# Patient Record
Sex: Female | Born: 1940 | Race: White | Hispanic: No | Smoking: Current every day smoker
Health system: Southern US, Community
[De-identification: ages and names within clinical notes are randomized; demographics above are authoritative.]

## PROBLEM LIST (undated history)

## (undated) DIAGNOSIS — K439 Ventral hernia without obstruction or gangrene: Secondary | ICD-10-CM

---

## 2010-01-17 ENCOUNTER — Emergency Department (HOSPITAL_COMMUNITY): Admission: EM | Admit: 2010-01-17 | Discharge: 2010-01-17 | Payer: Self-pay | Admitting: Emergency Medicine

## 2010-07-22 LAB — URINALYSIS, ROUTINE W REFLEX MICROSCOPIC
Bilirubin Urine: NEGATIVE
Glucose, UA: NEGATIVE mg/dL
Hgb urine dipstick: NEGATIVE
Ketones, ur: NEGATIVE mg/dL
Protein, ur: NEGATIVE mg/dL
pH: 6.5 (ref 5.0–8.0)

## 2010-07-22 LAB — BASIC METABOLIC PANEL
CO2: 21 mEq/L (ref 19–32)
Calcium: 9 mg/dL (ref 8.4–10.5)
Chloride: 104 mEq/L (ref 96–112)
GFR calc Af Amer: >60 mL/min (ref >60)
Glucose, Bld: 134 mg/dL — ABNORMAL HIGH (ref 70–99)
Sodium: 134 mEq/L — ABNORMAL LOW (ref 135–145)

## 2010-07-22 LAB — CBC
Hemoglobin: 17.5 g/dL — ABNORMAL HIGH (ref 12.0–15.0)
MCH: 33 pg (ref 26.0–34.0)
MCHC: 35.2 g/dL (ref 30.0–36.0)
MCV: 93.6 fL (ref 78.0–100.0)
RBC: 5.31 MIL/uL — ABNORMAL HIGH (ref 3.87–5.11)

## 2020-12-24 ENCOUNTER — Encounter (HOSPITAL_COMMUNITY): Payer: Self-pay | Admitting: Internal Medicine

## 2020-12-24 ENCOUNTER — Emergency Department (HOSPITAL_COMMUNITY): Payer: Medicare HMO

## 2020-12-24 ENCOUNTER — Inpatient Hospital Stay (HOSPITAL_COMMUNITY)
Admission: EM | Admit: 2020-12-24 | Discharge: 2020-12-26 | DRG: 394 | Disposition: A | Discharge status: Home / Self Care | Payer: Medicare HMO | Attending: Family Medicine | Admitting: Family Medicine

## 2020-12-24 ENCOUNTER — Inpatient Hospital Stay (HOSPITAL_COMMUNITY): Payer: Medicare HMO

## 2020-12-24 DIAGNOSIS — Z9049 Acquired absence of other specified parts of digestive tract: Secondary | ICD-10-CM

## 2020-12-24 DIAGNOSIS — L03311 Cellulitis of abdominal wall: Secondary | ICD-10-CM | POA: Diagnosis present

## 2020-12-24 DIAGNOSIS — Z720 Tobacco use: Secondary | ICD-10-CM | POA: Diagnosis not present

## 2020-12-24 DIAGNOSIS — K436 Other and unspecified ventral hernia with obstruction, without gangrene: Principal | ICD-10-CM | POA: Diagnosis present

## 2020-12-24 DIAGNOSIS — I714 Abdominal aortic aneurysm, without rupture, unspecified: Secondary | ICD-10-CM | POA: Diagnosis present

## 2020-12-24 DIAGNOSIS — D72829 Elevated white blood cell count, unspecified: Secondary | ICD-10-CM | POA: Diagnosis not present

## 2020-12-24 DIAGNOSIS — L0390 Cellulitis, unspecified: Secondary | ICD-10-CM

## 2020-12-24 DIAGNOSIS — F17210 Nicotine dependence, cigarettes, uncomplicated: Secondary | ICD-10-CM | POA: Diagnosis present

## 2020-12-24 DIAGNOSIS — R112 Nausea with vomiting, unspecified: Secondary | ICD-10-CM | POA: Diagnosis not present

## 2020-12-24 DIAGNOSIS — K460 Unspecified abdominal hernia with obstruction, without gangrene: Secondary | ICD-10-CM | POA: Diagnosis not present

## 2020-12-24 DIAGNOSIS — Z20822 Contact with and (suspected) exposure to covid-19: Secondary | ICD-10-CM | POA: Diagnosis present

## 2020-12-24 DIAGNOSIS — K439 Ventral hernia without obstruction or gangrene: Secondary | ICD-10-CM | POA: Diagnosis not present

## 2020-12-24 DIAGNOSIS — K56609 Unspecified intestinal obstruction, unspecified as to partial versus complete obstruction: Secondary | ICD-10-CM | POA: Diagnosis present

## 2020-12-24 HISTORY — DX: Ventral hernia without obstruction or gangrene: K43.9

## 2020-12-24 LAB — CBC WITH DIFFERENTIAL/PLATELET
Abs Immature Granulocytes: 6.5 10*3/uL — ABNORMAL HIGH (ref 0.00–0.07)
Basophils Absolute: 0 10*3/uL (ref 0.0–0.1)
Basophils Relative: 0 %
Eosinophils Absolute: 1 10*3/uL — ABNORMAL HIGH (ref 0.0–0.5)
Eosinophils Relative: 2 %
HCT: 39.8 % (ref 36.0–46.0)
Hemoglobin: 12.6 g/dL (ref 12.0–15.0)
Lymphocytes Relative: 4 %
Lymphs Abs: 2 10*3/uL (ref 0.7–4.0)
MCH: 33.8 pg (ref 26.0–34.0)
MCHC: 31.7 g/dL (ref 30.0–36.0)
MCV: 106.7 fL — ABNORMAL HIGH (ref 80.0–100.0)
Metamyelocytes Relative: 2 %
Monocytes Absolute: 5 10*3/uL — ABNORMAL HIGH (ref 0.1–1.0)
Monocytes Relative: 10 %
Myelocytes: 10 %
Neutro Abs: 35.6 10*3/uL — ABNORMAL HIGH (ref 1.7–7.7)
Neutrophils Relative %: 71 %
Platelets: 177 10*3/uL (ref 150–400)
Promyelocytes Relative: 1 %
RBC: 3.73 MIL/uL — ABNORMAL LOW (ref 3.87–5.11)
RDW: 15.1 % (ref 11.5–15.5)
WBC: 50.1 10*3/uL (ref 4.0–10.5)
nRBC: 0 % (ref 0.0–0.2)
nRBC: 0 /100 WBC

## 2020-12-24 LAB — COMPREHENSIVE METABOLIC PANEL
ALT: 19 U/L (ref 0–44)
AST: 43 U/L — ABNORMAL HIGH (ref 15–41)
Albumin: 4.1 g/dL (ref 3.5–5.0)
Alkaline Phosphatase: 72 U/L (ref 38–126)
Anion gap: 10 (ref 5–15)
BUN: 11 mg/dL (ref 8–23)
CO2: 25 mmol/L (ref 22–32)
Calcium: 9.1 mg/dL (ref 8.9–10.3)
Chloride: 106 mmol/L (ref 98–111)
Creatinine, Ser: 0.77 mg/dL (ref 0.44–1.00)
GFR, Estimated: >60 mL/min (ref >60)
Glucose, Bld: 136 mg/dL — ABNORMAL HIGH (ref 70–99)
Potassium: 3.7 mmol/L (ref 3.5–5.1)
Sodium: 141 mmol/L (ref 135–145)
Total Bilirubin: 1.2 mg/dL (ref 0.3–1.2)
Total Protein: 6.6 g/dL (ref 6.5–8.1)

## 2020-12-24 LAB — URINALYSIS, ROUTINE W REFLEX MICROSCOPIC
Bilirubin Urine: NEGATIVE
Glucose, UA: NEGATIVE mg/dL
Ketones, ur: NEGATIVE mg/dL
Leukocytes,Ua: NEGATIVE
Nitrite: NEGATIVE
Protein, ur: NEGATIVE mg/dL
Specific Gravity, Urine: 1.011 (ref 1.005–1.030)
pH: 5 (ref 5.0–8.0)

## 2020-12-24 LAB — RESP PANEL BY RT-PCR (FLU A&B, COVID) ARPGX2
Influenza A by PCR: NEGATIVE
Influenza B by PCR: NEGATIVE
SARS Coronavirus 2 by RT PCR: NEGATIVE

## 2020-12-24 LAB — LIPASE, BLOOD: Lipase: 25 U/L (ref 11–51)

## 2020-12-24 MED ORDER — IOHEXOL 350 MG/ML SOLN
75.0000 mL | Freq: Once | INTRAVENOUS | Status: AC | PRN
  Administered 2020-12-24: 75 mL via INTRAVENOUS

## 2020-12-24 MED ORDER — CEFAZOLIN SODIUM-DEXTROSE 1-4 GM/50ML-% IV SOLN
1.0000 g | Freq: Once | INTRAVENOUS | Status: AC
  Administered 2020-12-24: 1 g via INTRAVENOUS
  Filled 2020-12-24: qty 50

## 2020-12-24 MED ORDER — LACTATED RINGERS IV SOLN: INTRAVENOUS | Status: DC

## 2020-12-24 MED ORDER — ACETAMINOPHEN 650 MG RE SUPP: 650.0000 mg | Freq: Four times a day (QID) | RECTAL | Status: DC | PRN

## 2020-12-24 MED ORDER — FENTANYL CITRATE PF 50 MCG/ML IJ SOSY
25.0000 ug | PREFILLED_SYRINGE | INTRAMUSCULAR | Status: DC | PRN
  Administered 2020-12-25: 25 ug via INTRAVENOUS
  Filled 2020-12-24 (×2): qty 1

## 2020-12-24 MED ORDER — CEFAZOLIN SODIUM-DEXTROSE 1-4 GM/50ML-% IV SOLN
1.0000 g | Freq: Three times a day (TID) | INTRAVENOUS | Status: DC
  Administered 2020-12-25 – 2020-12-26 (×4): 1 g via INTRAVENOUS
  Filled 2020-12-24 (×5): qty 50

## 2020-12-24 MED ORDER — ONDANSETRON HCL 4 MG/2ML IJ SOLN
4.0000 mg | Freq: Four times a day (QID) | INTRAMUSCULAR | Status: DC | PRN
  Filled 2020-12-24: qty 2

## 2020-12-24 MED ORDER — ACETAMINOPHEN 325 MG PO TABS: 650.0000 mg | ORAL_TABLET | Freq: Four times a day (QID) | ORAL | Status: DC | PRN

## 2020-12-24 MED ORDER — ONDANSETRON HCL 4 MG/2ML IJ SOLN
4.0000 mg | Freq: Once | INTRAMUSCULAR | Status: AC
  Administered 2020-12-24: 4 mg via INTRAVENOUS
  Filled 2020-12-24: qty 2

## 2020-12-24 MED ORDER — NALOXONE HCL 0.4 MG/ML IJ SOLN: 0.4000 mg | INTRAMUSCULAR | Status: DC | PRN

## 2020-12-24 NOTE — Consult Note (Signed)
Reason for Consult: Large abdominal wall hernia with bowel obstruction Referring Physician: Arline Asp is an 80 y.o. female.  HPI: 80 year old female who does not regularly see a physician presented to the emergency department complaining of abdominal pain.  She reports that she has a large abdominal wall hernia which she has had for 15 years or more.  Over the past 2 days she felt it became larger and she had pain in the area.  This initially started after her dog ran across her abdomen.  She had 1 episode of emesis today and came to the emergency department for further evaluation.  She underwent CT scan of the abdomen and pelvis which shows a very large hernia with evidence of bowel obstruction.  There is also evidence of cellulitis in her pannus on CT.  She has not noticed this and it is not evident on exam.  She reports her pain has resolved and her hernia is much softer now.  I was asked to see her for surgical management.  No past medical history on file.  No family history on file.  Social History:  has no history on file for tobacco use, alcohol use, and drug use.  Allergies: Not on File  Medications: I have reviewed the patient's current medications.  Results for orders placed or performed during the hospital encounter of 12/24/20 (from the past 48 hour(s))  CBC with Differential     Status: Abnormal   Collection Time: 12/24/20  5:03 PM  Result Value Ref Range   WBC 50.1 (HH) 4.0 - 10.5 K/uL    Comment: This critical result has verified and been called to C STRAUGHN,RN by Doran Durand on 08 18 2022 at 1903, and has been read back.    RBC 3.73 (L) 3.87 - 5.11 MIL/uL   Hemoglobin 12.6 12.0 - 15.0 g/dL   HCT 35.7 01.7 - 79.3 %   MCV 106.7 (H) 80.0 - 100.0 fL   MCH 33.8 26.0 - 34.0 pg   MCHC 31.7 30.0 - 36.0 g/dL   RDW 90.3 00.9 - 23.3 %   Platelets 177 150 - 400 K/uL   nRBC 0.0 0.0 - 0.2 %   Neutrophils Relative % 71 %   Neutro Abs 35.6 (H) 1.7 - 7.7 K/uL    Lymphocytes Relative 4 %   Lymphs Abs 2.0 0.7 - 4.0 K/uL   Monocytes Relative 10 %   Monocytes Absolute 5.0 (H) 0.1 - 1.0 K/uL   Eosinophils Relative 2 %   Eosinophils Absolute 1.0 (H) 0.0 - 0.5 K/uL   Basophils Relative 0 %   Basophils Absolute 0.0 0.0 - 0.1 K/uL   WBC Morphology See Note     Comment: Moderate Left Shift. >5% Metas and Myelos, Occ Pro Noted.   RBC Morphology MORPHOLOGY UNREMARKABLE    Smear Review PLATELET COUNT CONFIRMED BY SMEAR    nRBC 0 0 /100 WBC   Metamyelocytes Relative 2 %   Myelocytes 10 %   Promyelocytes Relative 1 %   Abs Immature Granulocytes 6.50 (H) 0.00 - 0.07 K/uL   Polychromasia PRESENT     Comment: Performed at [facility], [address] [place], [place] [address]  Comprehensive metabolic panel     Status: Abnormal   Collection Time: 12/24/20  5:03 PM  Result Value Ref Range   Sodium 141 135 - 145 mmol/L   Potassium 3.7 3.5 - 5.1 mmol/L   Chloride 106 98 - 111 mmol/L  CO2 25 22 - 32 mmol/L   Glucose, Bld 136 (H) 70 - 99 mg/dL    Comment: Glucose reference range applies only to samples taken after fasting for at least 8 hours.   BUN 11 8 - 23 mg/dL   Creatinine, Ser 1.610.77 0.44 - 1.00 mg/dL   Calcium 9.1 8.9 - 09.610.3 mg/dL   Total Protein 6.6 6.5 - 8.1 g/dL   Albumin 4.1 3.5 - 5.0 g/dL   AST 43 (H) 15 - 41 U/L   ALT 19 0 - 44 U/L   Alkaline Phosphatase 72 38 - 126 U/L   Total Bilirubin 1.2 0.3 - 1.2 mg/dL   GFR, Estimated >04>60 >54>60 mL/min    Comment: (NOTE) Calculated using the CKD-EPI Creatinine Equation (2021)    Anion gap 10 5 - 15    Comment: Performed at [facility], [address] [place], [place] [address]  Lipase, blood     Status: None   Collection Time: 12/24/20  5:03 PM  Result Value Ref Range   Lipase 25 11 - 51 U/L    Comment: Performed at [facility], [address] [place], [place] [address]  Resp Panel by RT-PCR (Flu A&B, Covid) Nasopharyngeal Swab     Status: None   Collection Time: 12/24/20  5:05  PM   Specimen: Nasopharyngeal Swab; Nasopharyngeal(NP) swabs in vial transport medium  Result Value Ref Range   SARS Coronavirus 2 by RT PCR NEGATIVE NEGATIVE    Comment: (NOTE) SARS-CoV-2 target nucleic acids are NOT DETECTED.  The SARS-CoV-2 RNA is generally detectable in upper respiratory specimens during the acute phase of infection. The lowest concentration of SARS-CoV-2 viral copies this assay can detect is 138 copies/mL. A negative result does not preclude SARS-Cov-2 infection and should not be used as the sole basis for treatment or other patient management decisions. A negative result may occur with  improper specimen collection/handling, submission of specimen other than nasopharyngeal swab, presence of viral mutation(s) within the areas targeted by this assay, and inadequate number of viral copies(<138 copies/mL). A negative result must be combined with clinical observations, patient history, and epidemiological information. The expected result is Negative.  Fact Sheet for Patients:  BloggerCourse.comhttps://www.fda.gov/media/152166/download  Fact Sheet for Healthcare Providers:  SeriousBroker.ithttps://www.fda.gov/media/152162/download  This test is no t yet approved or cleared by the [place] FDA and  has been authorized for detection and/or diagnosis of SARS-CoV-2 by FDA under an Emergency Use Authorization (EUA). This EUA will remain  in effect (meaning this test can be used) for the duration of the COVID-19 declaration under Section 564(b)(1) of the Act, 21 U.S.C.section 360bbb-3(b)(1), unless the authorization is terminated  or revoked sooner.       Influenza A by PCR NEGATIVE NEGATIVE   Influenza B by PCR NEGATIVE NEGATIVE    Comment: (NOTE) The Xpert Xpress SARS-CoV-2/FLU/RSV plus assay is intended as an aid in the diagnosis of influenza from Nasopharyngeal swab specimens and should not be used as a sole basis for treatment. Nasal washings and aspirates are unacceptable for Xpert  Xpress SARS-CoV-2/FLU/RSV testing.  Fact Sheet for Patients: BloggerCourse.comhttps://www.fda.gov/media/152166/download  Fact Sheet for Healthcare Providers: SeriousBroker.ithttps://www.fda.gov/media/152162/download  This test is not yet approved or cleared by the [place] FDA and has been authorized for detection and/or diagnosis of SARS-CoV-2 by FDA under an Emergency Use Authorization (EUA). This EUA will remain in effect (meaning this test can be used) for the duration of the COVID-19 declaration under Section 564(b)(1) of the  Act, 21 U.S.C. section 360bbb-3(b)(1), unless the authorization is terminated or revoked.  Performed at [facility], [address] [place], [place] [address]   Urinalysis, Routine w reflex microscopic Urine, In & Out Cath     Status: Abnormal   Collection Time: 12/24/20  5:54 PM  Result Value Ref Range   Color, Urine YELLOW YELLOW   APPearance HAZY (A) CLEAR   Specific Gravity, Urine 1.011 1.005 - 1.030   pH 5.0 5.0 - 8.0   Glucose, UA NEGATIVE NEGATIVE mg/dL   Hgb urine dipstick SMALL (A) NEGATIVE   Bilirubin Urine NEGATIVE NEGATIVE   Ketones, ur NEGATIVE NEGATIVE mg/dL   Protein, ur NEGATIVE NEGATIVE mg/dL   Nitrite NEGATIVE NEGATIVE   Leukocytes,Ua NEGATIVE NEGATIVE   RBC / HPF 0-5 0 - 5 RBC/hpf   WBC, UA 0-5 0 - 5 WBC/hpf   Bacteria, UA RARE (A) NONE SEEN   Squamous Epithelial / LPF 0-5 0 - 5   Mucus PRESENT     Comment: Performed at [facility], [address], [place], [place] [address]    CT Abdomen Pelvis W Contrast  Result Date: 12/24/2020 CLINICAL DATA:  Abdominal pain. EXAM: CT ABDOMEN AND PELVIS WITH CONTRAST TECHNIQUE: Multidetector CT imaging of the abdomen and pelvis was performed using the standard protocol following bolus administration of intravenous contrast. CONTRAST:  24mL OMNIPAQUE IOHEXOL 350 MG/ML SOLN COMPARISON:  January 17, 2010 FINDINGS: Lower chest: No acute abnormality. Hepatobiliary: No focal liver abnormality is seen. The  gallbladder is not identified. No biliary dilatation. Pancreas: Unremarkable. No pancreatic ductal dilatation or surrounding inflammatory changes. Spleen: Normal in size without focal abnormality. Adrenals/Urinary Tract: There is diffuse bilateral adrenal gland enlargement. Kidneys are normal, without renal calculi, focal lesion, or hydronephrosis. Bladder is unremarkable. Stomach/Bowel: Stomach is within normal limits. The appendix is not identified. Numerous dilated small bowel loops are seen within the abdomen and pelvis. A transition zone is seen along a region of mesenteric twisting within the mid pelvis, along the midline (axial CT images 53 through 57, CT series 3/coronal reformatted images 39 through 54, CT series 6). The small bowel loops return to normal caliber proximally within the posterior aspect of the pelvis on the right (axial CT images 60 through 70, CT series 3). Noninflamed diverticula are seen throughout the large bowel. Vascular/Lymphatic: Aortic atherosclerosis with 4.3 cm x 4.0 cm aneurysmal dilatation of the infrarenal abdominal aorta. No enlarged abdominal or pelvic lymph nodes. Reproductive: Status post hysterectomy. No adnexal masses. Other: A very large, approximately 23.3 cm x 9.8 cm x 17.4 cm ventral hernia and associated pannus is seen along the anterior aspect of the lower abdominal and pelvic wall. This contains a mild amount of fluid, numerous dilated small bowel loops and a portion of transverse colon. Diffuse stranding of the mesentery is also seen within this area of herniation. A marked amount of adjacent subcutaneous inflammatory fat stranding is seen within the lower portion of the associated pannus. Musculoskeletal: Multilevel degenerative changes seen throughout the lumbar spine. IMPRESSION: 1. High-grade small bowel obstruction secondary to an area of mesenteric twisting caused by a very large, incarcerated ventral hernia. 2. Large anterior abdominal and pelvic wall pannus  secondary to the previously noted ventral hernia with marked severity inferior cellulitis and edema. 3. 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 4. Colonic diverticulosis. Electronically Signed   By: Aram Candela M.D.   On: 12/24/2020 19:36    Review of Systems  Constitutional:  Negative for activity change.  HENT: Negative.    Eyes: Negative.   Respiratory: Negative.    Cardiovascular: Negative.   Gastrointestinal:  Positive for abdominal distention, abdominal pain and vomiting.  Endocrine: Negative.   Genitourinary: Negative.   Musculoskeletal: Negative.   Allergic/Immunologic: Negative.   Neurological: Negative.   Hematological: Negative.   Psychiatric/Behavioral: Negative.    Blood pressure (!) 150/87, pulse 66, temperature 98.4 F (36.9 C), resp. rate 13, SpO2 98 %. Physical Exam HENT:     Head: Normocephalic.  Eyes:     General: No scleral icterus.    Extraocular Movements: Extraocular movements intact.  Cardiovascular:     Rate and Rhythm: Normal rate and regular rhythm.     Heart sounds: No murmur heard. Pulmonary:     Effort: Pulmonary effort is normal.     Breath sounds: Normal breath sounds.  Abdominal:     Hernia: A hernia is present.     Comments: Very large abdominal hernia in the periumbilical region, it is very soft and partly reduces though it does not completely reduce.  There is no tenderness at this time.  I do not see any cellulitis in the skin that is reported on her CAT scan.  Skin:    General: Skin is warm and dry.  Neurological:     Mental Status: She is alert and oriented to person, place, and time.    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  This hernia is very soft but only partly reduces.  I fear she has lost some domain because it has been out for 15+ years.  Recommend NG tube decompression for now and  we will plan operative repair this admission.  Agree with antibiotics for the cellulitis seen on CAT scan, however, this is not notable on physical exam.  Her leukocytosis may not be all explained by this.  She has an abnormal differential.  I also spoke with her daughter.  We will follow closely.  Liz Malady 12/24/2020, 8:47 PM

## 2020-12-24 NOTE — H&P (Signed)
History and Physical    PLEASE NOTE THAT DRAGON DICTATION SOFTWARE WAS USED IN THE CONSTRUCTION OF THIS NOTE.   Anna Holmes BTD:[id number] DOB: July 09, 1940 DOA: 12/24/2020  PCP: Pcp, No Patient coming from: home   I have personally briefly reviewed patient's old medical records in [place]  Chief Complaint: abdominal pain  HPI: Anna Holmes is a 80 y.o. female with medical history significant for chronic abdominal wall hernia who is admitted to [facility] on 12/24/2020 with small bowel obstruction in the setting of incarcerated abdominal wall hernia after presenting from home to [facility] ED complaining of abdominal pain.   The patient reports 2 days of progressive abdominal pain. Describes pain as sharp discomfort located diffusely across the abdomen, most prominent along the midline, without radiation to back or legs. Has been constant since onset, and notes exacerbation with movement and with palpation over the abdomen. Also notes exacerbation with attempting to eat or drink. Notes associated intermittent nausea over that timeframe resulting in 1 episode of non-bloody/non-bilious emesis, with most recent episode of vomiting occurring earlier today.  Denies any vomiting over the last 2 days, admits diminished flatus production over that timeframe as well.  No recent melena or hematochezia. No recent trauma or travel. Denies any recent dysuria, gross hematuria, or change in urinary urgency/frequency. Denies any associated subjective fever, chills, rigors, or generalized myalgias.  She denies any recent rash, including no known erythema associated with the abdomen.  Of note, the patient knowledges that she is not followed with any medical provider for at least the last 15 years.  Within this context, she reports that her only known prior medical diagnosis is her chronic abdominal wall hernia, which she has had over the course of the last 15 years, without any prior surgical intervention  to correct such.  She denies any prior history of small bowel obstruction.  She notes multiple prior abdominal surgeries, including cholecystectomy at age 89 as well as hysterectomy at age 39, both of which she believes were open procedures.   Over the course of the last week, the patient reports that she has been spending progressively more time outside in her garden, and acknowledges that she has not compensatorily increased her intake of water over that timeframe.   Denies any recent headache, neck stiffness, rhinitis, rhinorrhea, sore throat, shortness of breath, wheezing, cough.  No known recent COVID-19 exposures.  Denies any recent chest pain, diaphoresis, or palpitations.  Not on any blood thinners as an outpatient, including no aspirin.     ED Course:  Vital signs in the ED were notable for the following: Temperature max 98.4, heart rate 66-82; blood pressure 150/87; respiratory rate 13-20, oxygen saturation 98 to 100% on room air.  Labs were notable for the following: CMP notable for the following: Sodium 141, bicarbonate 25, creatinine 0.77, AST 43, otherwise liver enzymes to be within normal limits.  Lipase 25.  CBC notable for the following: wbc of 50, 71% neutrophils, and no prior white blood cell data point available for point comparison, hemoglobin 12.6, platelets 177.  Urinalysis notable for no white blood cells, leukocyte Estrace/nitrate negative.  Screening COVID-19 PCR performed in the ED today was found to be negative.  CT abdomen/pelvis showed high-grade small bowel obstruction secondary to area of mesenteric twisting caused by very large incarcerated ventral hernia as well as large anterior abdominal and pelvic wall pannus secondary to ventral hernia with marked inferior cellulitis.  Otherwise, CT abdomen/pelvis without evidence  of acute intra-abdominal process.   EDP discussed patient's case with the on-call general surgeon, Dr.Thompson, Who has evaluated the patient and found  the abdominal wall hernia to be only partly reducible.  Dr.ThompsonRecommends admission to the hospital service for further evaluation management of SBO secondary to large incarcerated abdominal wall hernia, and plans to formally consult. He asks that patient be kept npo and recommends placement of NG tube, and plans to take the patient to the OR at some point during this hospitalization for surgery relating to presenting large incarcerated abdominal wall hernia.  Shortly, Dr.ThompsonRecommends antibiotics for cellulitis wall pannus identified on today CT abdomen/pelvis.   While in the ED, the following were administered: Ancef 1 g IV x1.     Review of Systems: As per HPI otherwise 10 point review of systems negative.   Past Medical History:  Diagnosis Date   Abdominal wall hernia     Past Surgical History:  Procedure Laterality Date   ABDOMINAL HYSTERECTOMY     did not remove ovaries   CHOLECYSTECTOMY     at age 71 y.o.    Social History:  reports that she has been smoking cigarettes. She has a 15.00 pack-year smoking history. She has never used smokeless tobacco. She reports that she does not drink alcohol and does not use drugs.   Family history reviewed and not pertinent    Prior to Admission medications   Medication Sig Start Date End Date Taking? Authorizing Provider  docusate sodium (COLACE) 100 MG capsule Take 200 mg by mouth daily as needed for mild constipation.   Yes [provider]  Ibuprofen 200 MG CAPS Take 2 capsules by mouth.   Yes [provider]     Objective    Physical Exam: Vitals:   12/24/20 1700 12/24/20 1715 12/24/20 1730 12/24/20 1830  BP: (!) 175/102   (!) 150/87  Pulse: 78 80 71 66  Resp: (!) 21 (!) 25 20 13   Temp:      SpO2: 99% 99% 99% 98%    General: appears to be stated age; alert, oriented Skin: warm, dry, no rash Head:  AT/ Mouth:  Oral mucosa membranes appear dry, normal dentition Neck: supple; trachea  midline Heart:  RRR; did not appreciate any M/R/G Lungs: CTAB, did not appreciate any wheezes, rales, or rhonchi Abdomen: + BS; soft, abdominal wall hernia noted; mild diffuse tenderness in the absence of guarding, rigidity, or rebound tenderness. Vascular: 2+ pedal pulses b/l; 2+ radial pulses b/l Extremities: no peripheral edema, no muscle wasting Neuro: strength and sensation intact in upper and lower extremities b/l     Labs on Admission: I have personally reviewed following labs and imaging studies  CBC: Recent Labs  Lab 12/24/20 1703  WBC 50.1*  NEUTROABS 35.6*  HGB 12.6  HCT 39.8  MCV 106.7*  PLT 250   Basic Metabolic Panel: Recent Labs  Lab 12/24/20 1703  NA 141  K 3.7  CL 106  CO2 25  GLUCOSE 136*  BUN 11  CREATININE 0.77  CALCIUM 9.1   GFR: CrCl cannot be calculated (Unknown ideal weight.). Liver Function Tests: Recent Labs  Lab 12/24/20 1703  AST 43*  ALT 19  ALKPHOS 72  BILITOT 1.2  PROT 6.6  ALBUMIN 4.1   Recent Labs  Lab 12/24/20 1703  LIPASE 25   No results for input(s): AMMONIA in the last 168 hours. Coagulation Profile: No results for input(s): INR, PROTIME in the last 168 hours. Cardiac Enzymes: No results  for input(s): CKTOTAL, CKMB, CKMBINDEX, TROPONINI in the last 168 hours. BNP (last 3 results) No results for input(s): PROBNP in the last 8760 hours. HbA1C: No results for input(s): HGBA1C in the last 72 hours. CBG: No results for input(s): GLUCAP in the last 168 hours. Lipid Profile: No results for input(s): CHOL, HDL, LDLCALC, TRIG, CHOLHDL, LDLDIRECT in the last 72 hours. Thyroid Function Tests: No results for input(s): TSH, T4TOTAL, FREET4, T3FREE, THYROIDAB in the last 72 hours. Anemia Panel: No results for input(s): VITAMINB12, FOLATE, FERRITIN, TIBC, IRON, RETICCTPCT in the last 72 hours. Urine analysis:    Component Value Date/Time   COLORURINE YELLOW 12/24/2020 1754   APPEARANCEUR HAZY (A) 12/24/2020 1754    LABSPEC 1.011 12/24/2020 1754   PHURINE 5.0 12/24/2020 1754   GLUCOSEU NEGATIVE 12/24/2020 1754   HGBUR SMALL (A) 12/24/2020 1754   BILIRUBINUR NEGATIVE 12/24/2020 1754   KETONESUR NEGATIVE 12/24/2020 1754   PROTEINUR NEGATIVE 12/24/2020 1754   UROBILINOGEN 1.0 01/17/2010 1132   NITRITE NEGATIVE 12/24/2020 1754   LEUKOCYTESUR NEGATIVE 12/24/2020 1754    Radiological Exams on Admission: CT Abdomen Pelvis W Contrast  Result Date: 12/24/2020 CLINICAL DATA:  Abdominal pain. EXAM: CT ABDOMEN AND PELVIS WITH CONTRAST TECHNIQUE: Multidetector CT imaging of the abdomen and pelvis was performed using the standard protocol following bolus administration of intravenous contrast. CONTRAST:  63m OMNIPAQUE IOHEXOL 350 MG/ML SOLN COMPARISON:  January 17, 2010 FINDINGS: Lower chest: No acute abnormality. Hepatobiliary: No focal liver abnormality is seen. The gallbladder is not identified. No biliary dilatation. Pancreas: Unremarkable. No pancreatic ductal dilatation or surrounding inflammatory changes. Spleen: Normal in size without focal abnormality. Adrenals/Urinary Tract: There is diffuse bilateral adrenal gland enlargement. Kidneys are normal, without renal calculi, focal lesion, or hydronephrosis. Bladder is unremarkable. Stomach/Bowel: Stomach is within normal limits. The appendix is not identified. Numerous dilated small bowel loops are seen within the abdomen and pelvis. A transition zone is seen along a region of mesenteric twisting within the mid pelvis, along the midline (axial CT images 53 through 57, CT series 3/coronal reformatted images 39 through 54, CT series 6). The small bowel loops return to normal caliber proximally within the posterior aspect of the pelvis on the right (axial CT images 60 through 70, CT series 3). Noninflamed diverticula are seen throughout the large bowel. Vascular/Lymphatic: Aortic atherosclerosis with 4.3 cm x 4.0 cm aneurysmal dilatation of the infrarenal abdominal aorta.  No enlarged abdominal or pelvic lymph nodes. Reproductive: Status post hysterectomy. No adnexal masses. Other: A very large, approximately 23.3 cm x 9.8 cm x 17.4 cm ventral hernia and associated pannus is seen along the anterior aspect of the lower abdominal and pelvic wall. This contains a mild amount of fluid, numerous dilated small bowel loops and a portion of transverse colon. Diffuse stranding of the mesentery is also seen within this area of herniation. A marked amount of adjacent subcutaneous inflammatory fat stranding is seen within the lower portion of the associated pannus. Musculoskeletal: Multilevel degenerative changes seen throughout the lumbar spine. IMPRESSION: 1. High-grade small bowel obstruction secondary to an area of mesenteric twisting caused by a very large, incarcerated ventral hernia. 2. Large anterior abdominal and pelvic wall pannus secondary to the previously noted ventral hernia with marked severity inferior cellulitis and edema. 3. 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 4. Colonic diverticulosis. Electronically Signed   By: TVirgina NorfolkM.D.   On: 12/24/2020 19:36      Assessment/Plan   Anna VANALSTINEis  a 80 y.o. female with medical history significant for chronic abdominal wall hernia who is admitted to [facility] on 12/24/2020 with small bowel obstruction in the setting of incarcerated abdominal wall hernia after presenting from home to [facility] ED complaining of abdominal pain.    Principal Problem:   SBO (small bowel obstruction) (HCC) Active Problems:   Abdominal wall hernia   Nausea & vomiting   Cellulitis   Leukocytosis   Tobacco abuse     #) Small bowel obstruction: dx on the basis of acute onset abdominal pain over the last 2 days associated with nausea/vomiting and diminished flatus production, with CT abdomen/pelvis,  suggestive of high grade sbo secondary to area of mesenteric twisting caused by large incarcerated ventral hernia.   While the patient appears to have history of multiple prior open abdominal surgeries 30 to 40 years ago, as further outlined above, appears that her presenting SBO is more likely on the basis of the aforementioned large incarcerated ventral hernia. No evidence of peritoneal signs on initial physical exam. Dr. Grandville Silos of gen surg consulted, as above, with associated recommendations including n.p.o., NG tube, and plan to take the patient to the OR at some point during this hospitalization for surgical intervention relating to large incarcerated abdominal wall hernia.  Conservative measures to be initiated overnight, as further detailed below.   Plan: Strict NPO. LR @ 75 cc per hour. Prn IV fentanyl. Prn IV Zofran. NG tube attached to low, intermittent wall suction. SCD's for DVT prophylaxis  Recheck CMP and CBC in the morning. Check INR. General surgery consult, as above.  Preop EKG ordered.  Further evaluation management of underlying abdominal wall hernia, as further detailed below.     #) Large incarcerated abdominal wall hernia: In the setting of the patient's report of a chronic abdominal wall hernia over the course of the last 15 years, she notes progressive increase in size of this hernia over the last 2 days associated with worsening abdominal discomfort, with presenting SBO felt to be on the basis of this large incarcerated abdominal wall hernia, as further detailed above.  General surgery consulted, considering finding that hernias on the partly reducible.  General surgery anticipates taking patient to the OR for surgical intervention relating to this abdominal wall hernia at some point during this hospitalization, as above.  Plan: NPO.  As needed IV fentanyl.  As needed IV Zofran.  Continuous IV fluids, as above.  Check INR.  Check preoperative EKG.  General surgery formally consulted, as above.  Repeat CMP and CBC in the morning.       #) Cellulitis of the pelvic wall pannus: while no  evidence of significant erythema on physical exam, today CT abdomen/pelvis shows evidence of pelvic wall pannus with marked cellulitis.  General surgery consult associated with recommendation for continuation of IV antibiotics for such.  No evidence of associated abscess.  Of note, while the has an impressive leukocytosis, no additional SIRS criteria are met at this time.  Therefore criteria for sepsis are not currently met.  Patient's cellulitis, in the setting of a single SIRS criteria meets criteria to be considered moderately severe, with associated recommendation from antibiotic stewardship committee include initiation of IV cephalosporin for nonpurulent cellulitis of moderate severity.  Of note, the patient received a dose of Ancef in the ED this evening prior to collection of any blood cultures.  No evidence of additional underlying infectious process at this time.   Plan: Continue Ancef, per rationale above.  Repeat CBC in the morning.  As needed IV fentanyl, as above.       #) Leukocytosis: In the absence of any prior CBC results for point comparison, today CBC reflects significantly elevated white blood cell count of 50,000 with 71% neutrophils.  Likely multifactorial in nature, with at least some contribution from inflammation in the setting of incarcerated abdominal wall hernia, resultant SBO, as well as marked cellulitis of the pelvic wall pannus, as further identified.  Additional contribution suspected from hemoconcentration in the setting of dehydration given the patient's recent working outside garden without compensatory increase in oral intake of water, further complicated by increase in GI losses over the last 1 to 2 days.  will add on peripheral smear to be evaluated by pathologist.  No evidence to suggest C. difficile colitis.   Plan: Add on peripheral smear to be evaluated by pathologist.  IV fluids, as above.  Monitor strict I's and O's and daily weights.  Repeat CBC with  differential in the morning.  Further evaluation management of SBO and abdominal wall hernia, as above.       #) Chronic tobacco abuse: The patient reports that she has a current smoker, smoking a variable amount of cigarettes on a daily basis over the course of the last 30 to 40 years.   Plan: Counseled the patient on the importance of complete smoking discontinuation.        DVT prophylaxis: SCDs Code Status: Full code Family Communication: Case was discussed with the patient's daughter, who was present at bedside Disposition Plan: Per Rounding Team Consults called: Dr. Grandville Silos of general surgery consulted, as further detailed above;  Admission status: Inpatient; MedSurg     Of note, this patient was added by me to the following Admit List/Treatment Team: [email or web address].      PLEASE NOTE THAT DRAGON DICTATION SOFTWARE WAS USED IN THE CONSTRUCTION OF THIS NOTE.   [place] [facility] Pager [phone] From [place]  Otherwise, please contact night-coverage  [email or web address] Password [facility]   12/24/2020, 9:33 PM

## 2020-12-24 NOTE — ED Triage Notes (Signed)
Pt BIB [facility] after reporting abd pain since last night, worse today after her dog jumped on her abd at a hernia site that she believes is worsening. Pt reports to EMS bladder spasms with difficulty urinating and constipation. All symptoms fentanyl, N/V manifested after last dose of fentanyl at 1533. Pt received fentanyl en route, pt reports 5/10 pain at this time. A&O x4

## 2020-12-24 NOTE — ED Notes (Signed)
Patient transported to CT 

## 2020-12-24 NOTE — ED Provider Notes (Addendum)
[facility] EMERGENCY DEPARTMENT Provider Note   CSN: [id number] Arrival date & time: 12/24/20  1551     History Chief Complaint  Patient presents with  . Abdominal Pain    Anna Holmes is a 80 y.o. female.   Abdominal Pain Associated symptoms: nausea and vomiting   Associated symptoms: no diarrhea, no dysuria, no fatigue, no fever and no hematuria    Patient presents with abdominal pain x2 days.  She states it started when her schnauzer jumped across her abdomen, the pain resolved but started again this morning without inciting event.  She states she usually has a hernia, but the pain surrounding the hernia is more than normal and it is more distended than it has been in the past.  She has not been able to reduce it herself, although she states she has not tried that hard.  She has tried Tylenol at home, was given fentanyl on the way to the ED.  She started feeling nauseated and had 1 episode of emesis.  No episodes of emesis while at home.  Pain is constant, localized to the abdominal site.  No skin discoloration.  No past medical history on file.  There are no problems to display for this patient.      OB History   No obstetric history on file.     No family history on file.     Home Medications Prior to Admission medications   Not on File    Allergies    Patient has no allergy information on record.  Review of Systems   Review of Systems  Constitutional:  Negative for fatigue and fever.  Gastrointestinal:  Positive for abdominal pain, nausea and vomiting. Negative for diarrhea.  Genitourinary:  Negative for dysuria and hematuria.   Physical Exam Updated Vital Signs BP (!) 148/131   Pulse 85   Temp 98.4 F (36.9 C)   Resp 18   SpO2 99%   Physical Exam Vitals and nursing note reviewed. Exam conducted with a chaperone present.  Constitutional:      General: She is not in acute distress.    Appearance: Normal appearance.  HENT:      Head: Normocephalic and atraumatic.  Eyes:     General: No scleral icterus.    Extraocular Movements: Extraocular movements intact.     Pupils: Pupils are equal, round, and reactive to light.  Abdominal:     Tenderness: There is generalized abdominal tenderness.     Hernia: A hernia is present.     Comments: Voluntary guarding to the hernia, please see attached photos.  Skin:    Coloration: Skin is not jaundiced.  Neurological:     Mental Status: She is alert. Mental status is at baseline.     Coordination: Coordination normal.       ED Results / Procedures / Treatments   Labs (all labs ordered are listed, but only abnormal results are displayed) Labs Reviewed  CBC WITH DIFFERENTIAL/PLATELET  COMPREHENSIVE METABOLIC PANEL  LIPASE, BLOOD  URINALYSIS, ROUTINE W REFLEX MICROSCOPIC    EKG None  Radiology No results found.  Procedures Procedures   Medications Ordered in ED Medications - No data to display  ED Course  I have reviewed the triage vital signs and the nursing notes.  Pertinent labs & imaging results that were available during my care of the patient were reviewed by me and considered in my medical decision making (see chart for details).  Clinical Course as of  12/24/20 1918  Thu Dec 24, 2020  1830 Urinalysis, Routine w reflex microscopic Urine, In & Out Cath(!) [HS]  1830 No UTI [HS]  1830 Lipase, blood Not consistent with pancreatitis  [HS]  1831 Comprehensive metabolic panel(!) No electrolyte derangement or anion gap  [HS]  1918 SBO - needs admission  [HS]    Clinical Course User Index [HS] Theron Arista, PA-C   MDM Rules/Calculators/A&P                           Patient presents with stable vitals, though mildly hypertensive.  Pain is controlled with fentanyl, she is currently feeling nauseated will give Zofran.  Abdomen is soft, although there is an obvious hernia.  No discoloration suggesting strangulated, suspect is incarcerated.  Will get CT  with contrast ideally to assess, will also try to manually reduce.  Patient has a small bowel obstruction.  She will need to be admitted. Dr. Edwin Dada to follow and admit.   Final Clinical Impression(s) / ED Diagnoses Final diagnoses:  None    Rx / DC Orders ED Discharge Orders     None        Theron Arista, PA-C 12/24/20 1858    Theron Arista, PA-C 12/24/20 1916    Franne Forts, DO 12/25/20 0018

## 2020-12-24 NOTE — ED Provider Notes (Addendum)
   8:24 PM Patient signed out to me by APP physician.   Patient is a 80 yo female presenting with large abdominal hernia and pain. CT suggestive of large incarcerated hernia resulting in SBO. Pt placed NPO. General surgery, Dr. Violeta Gelinas consulted and up to date on patient's status. NG tube ordered. Patient informed of findings and recommendations for admission.   Vitals:   12/24/20 1730 12/24/20 1830  BP:  (!) 150/87  Pulse: 71 66  Resp: 20 13  Temp:    SpO2: 99% 98%    Physical Exam Vitals and nursing note reviewed.  Constitutional:      General: She is not in acute distress.    Appearance: She is well-developed.  Abdominal:     General: There is distension.     Tenderness: There is abdominal tenderness.       Comments: Large abdominal hernia. Unable to reduce at bedside.    Neurological:     Mental Status: She is alert.     .Critical Care  Date/Time: 12/25/2020 12:55 AM Performed by: Franne Forts, DO Authorized by: Franne Forts, DO   Critical care provider statement:    Critical care time (minutes):  44   Critical care was necessary to treat or prevent imminent or life-threatening deterioration of the following conditions: Incarcerated hernia resulting in cellulitis and small obstruction requiring immediate revaluation by surgical team, npo status, and ng tube placement. Plan for surgery in AM.   Critical care was time spent personally by me on the following activities:  Development of treatment plan with patient or surrogate, discussions with consultants, discussions with primary provider, evaluation of patient's response to treatment, examination of patient, ordering and performing treatments and interventions, ordering and review of laboratory studies, ordering and review of radiographic studies, re-evaluation of patient's condition and review of old charts   I assumed direction of critical care for this patient from another provider in my specialty: yes      Care discussed with: admitting provider     Care discussed with comment:  Discussed with general surgery       Franne Forts, DO 12/25/20 0019    Franne Forts, DO 12/25/20 [id number]

## 2020-12-25 DIAGNOSIS — D72829 Elevated white blood cell count, unspecified: Secondary | ICD-10-CM | POA: Diagnosis present

## 2020-12-25 DIAGNOSIS — K439 Ventral hernia without obstruction or gangrene: Secondary | ICD-10-CM | POA: Diagnosis present

## 2020-12-25 DIAGNOSIS — R112 Nausea with vomiting, unspecified: Secondary | ICD-10-CM | POA: Diagnosis present

## 2020-12-25 DIAGNOSIS — I714 Abdominal aortic aneurysm, without rupture, unspecified: Secondary | ICD-10-CM | POA: Diagnosis present

## 2020-12-25 DIAGNOSIS — K460 Unspecified abdominal hernia with obstruction, without gangrene: Secondary | ICD-10-CM

## 2020-12-25 DIAGNOSIS — L0390 Cellulitis, unspecified: Secondary | ICD-10-CM | POA: Diagnosis present

## 2020-12-25 DIAGNOSIS — Z720 Tobacco use: Secondary | ICD-10-CM | POA: Diagnosis present

## 2020-12-25 LAB — COMPREHENSIVE METABOLIC PANEL
ALT: 16 U/L (ref 0–44)
AST: 38 U/L (ref 15–41)
Albumin: 3.6 g/dL (ref 3.5–5.0)
Alkaline Phosphatase: 67 U/L (ref 38–126)
Anion gap: 11 (ref 5–15)
BUN: 10 mg/dL (ref 8–23)
CO2: 25 mmol/L (ref 22–32)
Calcium: 8.8 mg/dL — ABNORMAL LOW (ref 8.9–10.3)
Chloride: 102 mmol/L (ref 98–111)
Creatinine, Ser: 0.74 mg/dL (ref 0.44–1.00)
GFR, Estimated: >60 mL/min (ref >60)
Glucose, Bld: 84 mg/dL (ref 70–99)
Potassium: 3.5 mmol/L (ref 3.5–5.1)
Sodium: 138 mmol/L (ref 135–145)
Total Bilirubin: 1 mg/dL (ref 0.3–1.2)
Total Protein: 5.9 g/dL — ABNORMAL LOW (ref 6.5–8.1)

## 2020-12-25 LAB — CBC WITH DIFFERENTIAL/PLATELET
Abs Immature Granulocytes: 8.53 10*3/uL — ABNORMAL HIGH (ref 0.00–0.07)
Basophils Absolute: 0.2 10*3/uL — ABNORMAL HIGH (ref 0.0–0.1)
Basophils Relative: 0 %
Eosinophils Absolute: 0.1 10*3/uL (ref 0.0–0.5)
Eosinophils Relative: 0 %
HCT: 35.9 % — ABNORMAL LOW (ref 36.0–46.0)
Hemoglobin: 11.4 g/dL — ABNORMAL LOW (ref 12.0–15.0)
Immature Granulocytes: 14 %
Lymphocytes Relative: 4 %
Lymphs Abs: 2.5 10*3/uL (ref 0.7–4.0)
MCH: 33.8 pg (ref 26.0–34.0)
MCHC: 31.8 g/dL (ref 30.0–36.0)
MCV: 106.5 fL — ABNORMAL HIGH (ref 80.0–100.0)
Monocytes Absolute: 6.7 10*3/uL — ABNORMAL HIGH (ref 0.1–1.0)
Monocytes Relative: 11 %
Neutro Abs: 43.3 10*3/uL — ABNORMAL HIGH (ref 1.7–7.7)
Neutrophils Relative %: 71 %
Platelets: 187 10*3/uL (ref 150–400)
RBC: 3.37 MIL/uL — ABNORMAL LOW (ref 3.87–5.11)
RDW: 15.2 % (ref 11.5–15.5)
WBC: 61.4 10*3/uL (ref 4.0–10.5)
nRBC: 0 % (ref 0.0–0.2)

## 2020-12-25 LAB — PROTIME-INR
INR: 1.2 (ref 0.8–1.2)
Prothrombin Time: 15.5 seconds — ABNORMAL HIGH (ref 11.4–15.2)

## 2020-12-25 LAB — MAGNESIUM: Magnesium: 1.9 mg/dL (ref 1.7–2.4)

## 2020-12-25 MED ORDER — DIAZEPAM 5 MG/ML IJ SOLN
5.0000 mg | Freq: Four times a day (QID) | INTRAMUSCULAR | Status: DC | PRN
Start: 2020-12-25 — End: 2020-12-26

## 2020-12-25 MED ORDER — PHENOL 1.4 % MT LIQD
1.0000 | OROMUCOSAL | Status: DC | PRN
  Administered 2020-12-25: 1 via OROMUCOSAL
  Filled 2020-12-25: qty 177

## 2020-12-25 MED ORDER — DIAZEPAM 5 MG/ML IJ SOLN
2.5000 mg | Freq: Four times a day (QID) | INTRAMUSCULAR | Status: DC | PRN
  Administered 2020-12-25: 2.5 mg via INTRAVENOUS
  Filled 2020-12-25: qty 2

## 2020-12-25 NOTE — ED Notes (Signed)
Report given to [place] Bailiff, RN of [phone]

## 2020-12-25 NOTE — Progress Notes (Signed)
Dr Rito Ehrlich notified of  pt c/o of pain to left throat, left neck, ear area----pt states not sure if medicine given to her fentanyl IV helped any, only focused on the NG tube in, and arguing with daughter about she's going home tomorrow, she doesn't care about what any one says and wants to pull NGT out.    Attempted to explain need and purpose of the NG tube and what could happen to her (feel worse if gastric drainage backs up and makes her vomit and abd pain return).  Pt getting anxious and continues to argue with daughter. Pt and daughter left alone to continue talking.     Pt  received valium IV in the ER before coming up to unit.  Pt has prn order for if needs when time again.  Dr Rito Ehrlich gave orders for pt.

## 2020-12-25 NOTE — Progress Notes (Signed)
Subjective/Chief Complaint: Pt with some pain but feels better with NGT output   Objective: Vital signs in last 24 hours: Temp:  [97.8 F (36.6 C)-98.4 F (36.9 C)] 97.8 F (36.6 C) (08/19 0035) Pulse Rate:  [57-85] 72 (08/19 1015) Resp:  [13-25] 19 (08/19 1015) BP: (115-181)/(59-131) 146/69 (08/19 1015) SpO2:  [96 %-100 %] 99 % (08/19 1015)    Intake/Output from previous day: 08/18 0701 - 08/19 0700 In: 91.1 [IV Piggyback:91.1] Out: -  Intake/Output this shift: No intake/output data recorded.  PE:  Constitutional: No acute distress, conversant, appears states age. Eyes: Anicteric sclerae, moist conjunctiva, no lid lag Lungs: Clear to auscultation bilaterally, normal respiratory effort CV: regular rate and rhythm, no murmurs, no peripheral edema, pedal pulses 2+ GI: Soft, no masses or hepatosplenomegaly, non-tender to palpation, soft, large hernia  Skin: No rashes, palpation reveals normal turgor Psychiatric: appropriate judgment and insight, oriented to person, place, and time   Lab Results:  Recent Labs    12/24/20 1703 12/25/20 0218  WBC 50.1* 61.4*  HGB 12.6 11.4*  HCT 39.8 35.9*  PLT 177 187   BMET Recent Labs    12/24/20 1703 12/25/20 0218  NA 141 138  K 3.7 3.5  CL 106 102  CO2 25 25  GLUCOSE 136* 84  BUN 11 10  CREATININE 0.77 0.74  CALCIUM 9.1 8.8*   PT/INR Recent Labs    12/25/20 0218  LABPROT 15.5*  INR 1.2   ABG No results for input(s): PHART, HCO3 in the last 72 hours.  Invalid input(s): PCO2, PO2  Studies/Results: CT Abdomen Pelvis W Contrast  Result Date: 12/24/2020 CLINICAL DATA:  Abdominal pain. EXAM: CT ABDOMEN AND PELVIS WITH CONTRAST TECHNIQUE: Multidetector CT imaging of the abdomen and pelvis was performed using the standard protocol following bolus administration of intravenous contrast. CONTRAST:  56mL OMNIPAQUE IOHEXOL 350 MG/ML SOLN COMPARISON:  January 17, 2010 FINDINGS: Lower chest: No acute abnormality.  Hepatobiliary: No focal liver abnormality is seen. The gallbladder is not identified. No biliary dilatation. Pancreas: Unremarkable. No pancreatic ductal dilatation or surrounding inflammatory changes. Spleen: Normal in size without focal abnormality. Adrenals/Urinary Tract: There is diffuse bilateral adrenal gland enlargement. Kidneys are normal, without renal calculi, focal lesion, or hydronephrosis. Bladder is unremarkable. Stomach/Bowel: Stomach is within normal limits. The appendix is not identified. Numerous dilated small bowel loops are seen within the abdomen and pelvis. A transition zone is seen along a region of mesenteric twisting within the mid pelvis, along the midline (axial CT images 53 through 57, CT series 3/coronal reformatted images 39 through 54, CT series 6). The small bowel loops return to normal caliber proximally within the posterior aspect of the pelvis on the right (axial CT images 60 through 70, CT series 3). Noninflamed diverticula are seen throughout the large bowel. Vascular/Lymphatic: Aortic atherosclerosis with 4.3 cm x 4.0 cm aneurysmal dilatation of the infrarenal abdominal aorta. No enlarged abdominal or pelvic lymph nodes. Reproductive: Status post hysterectomy. No adnexal masses. Other: A very large, approximately 23.3 cm x 9.8 cm x 17.4 cm ventral hernia and associated pannus is seen along the anterior aspect of the lower abdominal and pelvic wall. This contains a mild amount of fluid, numerous dilated small bowel loops and a portion of transverse colon. Diffuse stranding of the mesentery is also seen within this area of herniation. A marked amount of adjacent subcutaneous inflammatory fat stranding is seen within the lower portion of the associated pannus. Musculoskeletal: Multilevel degenerative changes seen throughout the  lumbar spine. IMPRESSION: 1. High-grade small bowel obstruction secondary to an area of mesenteric twisting caused by a very large, incarcerated ventral  hernia. 2. Large anterior abdominal and pelvic wall pannus secondary to the previously noted ventral hernia with marked severity inferior cellulitis and edema. 3. 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 4. Colonic diverticulosis. Electronically Signed   By: Aram Candela M.D.   On: 12/24/2020 19:36   DG Chest Portable 1 View  Result Date: 12/24/2020 CLINICAL DATA:  NG placement. EXAM: PORTABLE CHEST 1 VIEW COMPARISON:  None. FINDINGS: Enteric tube with tip and side-port in the left hemiabdomen. The tip of the tube appears in the distal body of the stomach. No bowel dilatation in the visualized abdomen. Faint bilateral pulmonary streaky densities. No focal consolidation, pleural effusion, or pneumothorax. The cardiac silhouette is within limits. Atherosclerotic calcification of the aorta. No acute osseous pathology. IMPRESSION: 1. Enteric tube with tip in the distal body of the stomach. 2. Faint bilateral pulmonary streaky densities. Electronically Signed   By: Elgie Collard M.D.   On: 12/24/2020 23:06    Anti-infectives: Anti-infectives (From admission, onward)    Start     Dose/Rate Route Frequency Ordered Stop   12/25/20 0400  ceFAZolin (ANCEF) IVPB 1 g/50 mL premix        1 g 100 mL/hr over 30 Minutes Intravenous Every 8 hours 12/24/20 2132     12/24/20 2100  ceFAZolin (ANCEF) IVPB 1 g/50 mL premix        1 g 100 mL/hr over 30 Minutes Intravenous  Once 12/24/20 2047 12/24/20 2220       Assessment/Plan:  48F with SBO and large ventral hernia Leukocytosis -con't NGT for now -Pt with large hernia on exam and CT.  At this point with her leukocytosis would not want to place any mesh to fix her hernia.  I don't think the cellulitis on CT is accounting for her leukocytosis.  Pt may benefit from SBO treatment with outpt hernia repair once leukocytosis resolves.  LOS: 1 day    Axel Filler 12/25/2020

## 2020-12-25 NOTE — Progress Notes (Signed)
PROGRESS NOTE  Anna Holmes VOZ:[id number] DOB: Jun 08, 1940 DOA: 12/24/2020 PCP: Pcp, No  HPI/Recap of past 24 hours: Patient is a 80 year old female who does not follow with a primary care provider who presented to the emergency room on 8/18 with complaints of abdominal pain and nausea and vomiting.  Abdominal CT noted high-grade small bowel obstruction secondary to incarcerated hernia as well as noted to have some cellulitis.  Lab work noteworthy for markedly elevated white blood cell count of 50.  Patient admitted to hospitalist service and general surgery consulted.  NG tube placed.  Seen by general surgery who in the setting of markedly elevated white blood cell count recommended conservative management for bowel obstruction and outpatient surgical correction of hernia.  Patient herself is a little bit better although has some discomfort from NG tube.  States that she has passed a little gas  Assessment/Plan: Principal Problem:   SBO (small bowel obstruction) (HCC) secondary to abdominal wall hernia with associated cellulitis causing nausea and vomiting: On IV Ancef.  Checking procalcitonin level to ensure infection is worsening given increased white blood cell count.  May need to change antibiotics for bowel coverage.  Appreciate surgery help.  Continue NG tube and recheck abdominal x-ray in the morning. Active Problems:   Leukocytosis: Markedly elevated and given patient's lack of medical follow-up through the years, certainly possible she has lymphoma versus leukemia.  Waiting for peripheral smear reviewed by pathology.  We had an extensive discussion and she was adamant that if she did indeed have cancer, she immediately wanted to stop everything and just go home.  I informed her that is certainly appropriate, but if she did indeed have cancer then I would have oncology see her to give her full treatment options as some cancers are curable/treatable.    Tobacco abuse: Counseled.  Did not  need nicotine patch.    AAA (abdominal aortic aneurysm) (HCC): Incidentally noted on CT scan.  Stable at 4.4 cm.   Code Status: Full code  Family Communication: Daughter at bedside  Disposition Plan: Waiting for follow-up abdominal x-ray, pathology review of cell differential   Consultants: General surgery  Procedures: None  Antimicrobials: IV Ancef 8/18-present  DVT prophylaxis: SCDs  Level of care: Med-Surg   Objective: Vitals:   12/25/20 1406 12/25/20 1434  BP:  133/65  Pulse:  75  Resp:  18  Temp: 98.7 F (37.1 C) 98.7 F (37.1 C)  SpO2:  98%    Intake/Output Summary (Last 24 hours) at 12/25/2020 1613 Last data filed at 12/25/2020 1414 Gross per 24 hour  Intake 91.08 ml  Output 1700 ml  Net -1608.92 ml   There were no vitals filed for this visit. There is no height or weight on file to calculate BMI.  Exam:  General: Alert and oriented x3, no acute distress HEENT: Normocephalic, atraumatic, NG tube in place Cardiovascular: Regular rate and rhythm, S1-S2 Respiratory: Clear to auscultation bilaterally Abdomen: Soft, mild distention, minimal tenderness, hypoactive bowel sounds Musculoskeletal: No clubbing or cyanosis or edema Skin: No skin breaks, tears or lesions Psychiatry: Appropriate, no evidence of psychoses Neurology: No focal deficits   Data Reviewed: CBC: Recent Labs  Lab 12/24/20 1703 12/25/20 0218  WBC 50.1* 61.4*  NEUTROABS 35.6* 43.3*  HGB 12.6 11.4*  HCT 39.8 35.9*  MCV 106.7* 106.5*  PLT 177 187   Basic Metabolic Panel: Recent Labs  Lab 12/24/20 1703 12/25/20 0218  NA 141 138  K 3.7 3.5  CL 106 102  CO2 25 25  GLUCOSE 136* 84  BUN 11 10  CREATININE 0.77 0.74  CALCIUM 9.1 8.8*  MG  --  1.9   GFR: CrCl cannot be calculated (Unknown ideal weight.). Liver Function Tests: Recent Labs  Lab 12/24/20 1703 12/25/20 0218  AST 43* 38  ALT 19 16  ALKPHOS 72 67  BILITOT 1.2 1.0  PROT 6.6 5.9*  ALBUMIN 4.1 3.6    Recent Labs  Lab 12/24/20 1703  LIPASE 25   No results for input(s): AMMONIA in the last 168 hours. Coagulation Profile: Recent Labs  Lab 12/25/20 0218  INR 1.2   Cardiac Enzymes: No results for input(s): CKTOTAL, CKMB, CKMBINDEX, TROPONINI in the last 168 hours. BNP (last 3 results) No results for input(s): PROBNP in the last 8760 hours. HbA1C: No results for input(s): HGBA1C in the last 72 hours. CBG: No results for input(s): GLUCAP in the last 168 hours. Lipid Profile: No results for input(s): CHOL, HDL, LDLCALC, TRIG, CHOLHDL, LDLDIRECT in the last 72 hours. Thyroid Function Tests: No results for input(s): TSH, T4TOTAL, FREET4, T3FREE, THYROIDAB in the last 72 hours. Anemia Panel: No results for input(s): VITAMINB12, FOLATE, FERRITIN, TIBC, IRON, RETICCTPCT in the last 72 hours. Urine analysis:    Component Value Date/Time   COLORURINE YELLOW 12/24/2020 1754   APPEARANCEUR HAZY (A) 12/24/2020 1754   LABSPEC 1.011 12/24/2020 1754   PHURINE 5.0 12/24/2020 1754   GLUCOSEU NEGATIVE 12/24/2020 1754   HGBUR SMALL (A) 12/24/2020 1754   BILIRUBINUR NEGATIVE 12/24/2020 1754   KETONESUR NEGATIVE 12/24/2020 1754   PROTEINUR NEGATIVE 12/24/2020 1754   UROBILINOGEN 1.0 01/17/2010 1132   NITRITE NEGATIVE 12/24/2020 1754   LEUKOCYTESUR NEGATIVE 12/24/2020 1754   Sepsis Labs: @LABRCNTIP (procalcitonin:4,lacticidven:4)  ) Recent Results (from the past 240 hour(s))  Resp Panel by RT-PCR (Flu A&B, Covid) Nasopharyngeal Swab     Status: None   Collection Time: 12/24/20  5:05 PM   Specimen: Nasopharyngeal Swab; Nasopharyngeal(NP) swabs in vial transport medium  Result Value Ref Range Status   SARS Coronavirus 2 by RT PCR NEGATIVE NEGATIVE Final    Comment: (NOTE) SARS-CoV-2 target nucleic acids are NOT DETECTED.  The SARS-CoV-2 RNA is generally detectable in upper respiratory specimens during the acute phase of infection. The lowest concentration of SARS-CoV-2 viral copies  this assay can detect is 138 copies/mL. A negative result does not preclude SARS-Cov-2 infection and should not be used as the sole basis for treatment or other patient management decisions. A negative result may occur with  improper specimen collection/handling, submission of specimen other than nasopharyngeal swab, presence of viral mutation(s) within the areas targeted by this assay, and inadequate number of viral copies(<138 copies/mL). A negative result must be combined with clinical observations, patient history, and epidemiological information. The expected result is Negative.  Fact Sheet for Patients:  12/26/20  Fact Sheet for Healthcare Providers:  BloggerCourse.com  This test is no t yet approved or cleared by the [employer] FDA and  has been authorized for detection and/or diagnosis of SARS-CoV-2 by FDA under an Emergency Use Authorization (EUA). This EUA will remain  in effect (meaning this test can be used) for the duration of the COVID-19 declaration under Section 564(b)(1) of the Act, 21 U.S.C.section 360bbb-3(b)(1), unless the authorization is terminated  or revoked sooner.       Influenza A by PCR NEGATIVE NEGATIVE Final   Influenza B by PCR NEGATIVE NEGATIVE Final    Comment: (NOTE) The Xpert Xpress SARS-CoV-2/FLU/RSV plus assay is  intended as an aid in the diagnosis of influenza from Nasopharyngeal swab specimens and should not be used as a sole basis for treatment. Nasal washings and aspirates are unacceptable for Xpert Xpress SARS-CoV-2/FLU/RSV testing.  Fact Sheet for Patients: BloggerCourse.com  Fact Sheet for Healthcare Providers: [employer]ious[employer]ker[employer]it  This test is not yet approved or cleared by the [place] FDA and has been authorized for detection and/or diagnosis of SARS-CoV-2 by FDA under an Emergency Use Authorization (EUA). This EUA  will remain in effect (meaning this test can be used) for the duration of the COVID-19 declaration under Section 564(b)(1) of the Act, 21 U.S.C. section 360bbb-3(b)(1), unless the authorization is terminated or revoked.  Performed at [facility], [address] [place], [place] [address]       Studies: CT Abdomen Pelvis W Contrast  Result Date: 12/24/2020 CLINICAL DATA:  Abdominal pain. EXAM: CT ABDOMEN AND PELVIS WITH CONTRAST TECHNIQUE: Multidetector CT imaging of the abdomen and pelvis was performed using the standard protocol following bolus administration of intravenous contrast. CONTRAST:  22mL OMNIPAQUE IOHEXOL 350 MG/ML SOLN COMPARISON:  January 17, 2010 FINDINGS: Lower chest: No acute abnormality. Hepatobiliary: No focal liver abnormality is seen. The gallbladder is not identified. No biliary dilatation. Pancreas: Unremarkable. No pancreatic ductal dilatation or surrounding inflammatory changes. Spleen: Normal in size without focal abnormality. Adrenals/Urinary Tract: There is diffuse bilateral adrenal gland enlargement. Kidneys are normal, without renal calculi, focal lesion, or hydronephrosis. Bladder is unremarkable. Stomach/Bowel: Stomach is within normal limits. The appendix is not identified. Numerous dilated small bowel loops are seen within the abdomen and pelvis. A transition zone is seen along a region of mesenteric twisting within the mid pelvis, along the midline (axial CT images 53 through 57, CT series 3/coronal reformatted images 39 through 54, CT series 6). The small bowel loops return to normal caliber proximally within the posterior aspect of the pelvis on the right (axial CT images 60 through 70, CT series 3). Noninflamed diverticula are seen throughout the large bowel. Vascular/Lymphatic: Aortic atherosclerosis with 4.3 cm x 4.0 cm aneurysmal dilatation of the infrarenal abdominal aorta. No enlarged abdominal or pelvic lymph nodes. Reproductive: Status post  hysterectomy. No adnexal masses. Other: A very large, approximately 23.3 cm x 9.8 cm x 17.4 cm ventral hernia and associated pannus is seen along the anterior aspect of the lower abdominal and pelvic wall. This contains a mild amount of fluid, numerous dilated small bowel loops and a portion of transverse colon. Diffuse stranding of the mesentery is also seen within this area of herniation. A marked amount of adjacent subcutaneous inflammatory fat stranding is seen within the lower portion of the associated pannus. Musculoskeletal: Multilevel degenerative changes seen throughout the lumbar spine. IMPRESSION: 1. High-grade small bowel obstruction secondary to an area of mesenteric twisting caused by a very large, incarcerated ventral hernia. 2. Large anterior abdominal and pelvic wall pannus secondary to the previously noted ventral hernia with marked severity inferior cellulitis and edema. 3. 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 4. Colonic diverticulosis. Electronically Signed   By: Aram Candela M.D.   On: 12/24/2020 19:36   DG Chest Portable 1 View  Result Date: 12/24/2020 CLINICAL DATA:  NG placement. EXAM: PORTABLE CHEST 1 VIEW COMPARISON:  None. FINDINGS: Enteric tube with tip and side-port in the left hemiabdomen. The tip of the tube appears in the distal body of the stomach. No bowel dilatation in the visualized abdomen. Faint bilateral pulmonary streaky densities. No focal  consolidation, pleural effusion, or pneumothorax. The cardiac silhouette is within limits. Atherosclerotic calcification of the aorta. No acute osseous pathology. IMPRESSION: 1. Enteric tube with tip in the distal body of the stomach. 2. Faint bilateral pulmonary streaky densities. Electronically Signed   By: Elgie CollardArash  Radparvar M.D.   On: 12/24/2020 23:06    Scheduled Meds:  Continuous Infusions:   ceFAZolin (ANCEF) IV Stopped (12/25/20 1435)   lactated ringers 75 mL/hr at 12/25/20 0915     LOS: 1 day      Hollice EspySendil K Arwa Yero, MD [facility]   12/25/2020, 4:13 PM

## 2020-12-26 ENCOUNTER — Inpatient Hospital Stay (HOSPITAL_COMMUNITY): Payer: Medicare HMO

## 2020-12-26 LAB — PATHOLOGIST SMEAR REVIEW

## 2020-12-26 NOTE — Discharge Summary (Signed)
Physician Discharge Summary  Anna Holmes WUJ:[id number] DOB: 03-22-1941 DOA: 12/24/2020  PCP: Pcp, No  Admit date: 12/24/2020 Discharge date: 12/26/2020  Admitted From: Home  Disposition:  Home   Recommendations for Outpatient Follow-up:  Follow up with Dr. Derrell Lolling per patient Follow up with Oncology, instructions given      Home Health: None  Equipment/Devices: None new  Discharge Condition: Unclear  CODE STATUS: FULL Diet recommendation: Soft for now, advance diet slowly  Brief/Interim Summary: 80 yo F with no known PMHx who presented with acute abdominal pain and nausea and vomiting.  Abdominal CT noted high-grade small bowel obstruction secondary to incarcerated hernia as well as noted to have some cellulitis.  Lab work noteworthy for markedly elevated white blood cell count of 50.  Patient admitted to hospitalist service and general surgery consulted.  NG tube placed.     PRINCIPAL HOSPITAL DIAGNOSIS: Small bowel obstruction    Discharge Diagnoses:   Small bowel obstruction  Paitent admitted and NG tube plcaed.  Vomiting stopped.    Her NG tube was removed overnight, and she refused replacement nor further cares this morning, stated she was leaving.  Daughter present, confirmed her respect for patient's wishes.  Patient drank water all night without difficulty.  Has no redness or pain in the abdomen to warrant any antibiotics at discharge  Refused further care.  Stated she liked Dr. Derrell Lolling and was given Dr. Jacinto Halim phone number to reach him if she chooses to follow up.    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  Peripheral smear with primarily myelocytes.  Reviewed with Oncology who recommended in patient consultation and outpatient follow up for bone marrow biops within 1 week.   This was outlined to family, including the treatable nature of CML, and likely progression of untreated CML, as well as signs or symptoms of untreated CML.  Patient refused care,  stated unambiguously that she would not follow up with oncology.  Articulated a reasonable understanding of the potential benefits and harms of treatment.    She was provided with follow up instructions with Oncology should she change her mind.   Smoking  AAA Incidental finding, 4.4 cm.         Discharge Instructions  Discharge Instructions     Discharge instructions   Complete by: As directed    You were admitted for a small bowel obstruction You also have a hernia, which caused the obstruction  Advance your diet slowly Return for returning pain like you had before or vomiting that doesn't stop Go see Dr. Derrell Lolling listed at the number below   You were also found to have high white blood cell count.  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 I have respected your wishes not to arrange follow up Call the Cancer [facility] at the number below if desired Return for care if you have fever, bruising, confusion, severe weakness or bleeding   Increase activity slowly   Complete by: As directed       Allergies as of 12/26/2020   Not on File      Medication List     TAKE these medications    docusate sodium 100 MG capsule Commonly known as: COLACE Take 200 mg by mouth daily as needed for mild constipation.   Ibuprofen 200 MG Caps Take 2 capsules by mouth.        Follow-up Information     Axel Filler, MD Follow  up.   Specialty: General Surgery Contact information: [address] [place] [address] [phone]         Ladene Artist, MD .   Specialty: Oncology Contact information: [address] [place] [address] [phone]                Not on File  Consultations: General Surgery Oncology, patient left before being seen   Procedures/Studies: CT Abdomen Pelvis W Contrast  Result Date: 12/24/2020 CLINICAL DATA:  Abdominal pain. EXAM: CT ABDOMEN AND  PELVIS WITH CONTRAST TECHNIQUE: Multidetector CT imaging of the abdomen and pelvis was performed using the standard protocol following bolus administration of intravenous contrast. CONTRAST:  46mL OMNIPAQUE IOHEXOL 350 MG/ML SOLN COMPARISON:  January 17, 2010 FINDINGS: Lower chest: No acute abnormality. Hepatobiliary: No focal liver abnormality is seen. The gallbladder is not identified. No biliary dilatation. Pancreas: Unremarkable. No pancreatic ductal dilatation or surrounding inflammatory changes. Spleen: Normal in size without focal abnormality. Adrenals/Urinary Tract: There is diffuse bilateral adrenal gland enlargement. Kidneys are normal, without renal calculi, focal lesion, or hydronephrosis. Bladder is unremarkable. Stomach/Bowel: Stomach is within normal limits. The appendix is not identified. Numerous dilated small bowel loops are seen within the abdomen and pelvis. A transition zone is seen along a region of mesenteric twisting within the mid pelvis, along the midline (axial CT images 53 through 57, CT series 3/coronal reformatted images 39 through 54, CT series 6). The small bowel loops return to normal caliber proximally within the posterior aspect of the pelvis on the right (axial CT images 60 through 70, CT series 3). Noninflamed diverticula are seen throughout the large bowel. Vascular/Lymphatic: Aortic atherosclerosis with 4.3 cm x 4.0 cm aneurysmal dilatation of the infrarenal abdominal aorta. No enlarged abdominal or pelvic lymph nodes. Reproductive: Status post hysterectomy. No adnexal masses. Other: A very large, approximately 23.3 cm x 9.8 cm x 17.4 cm ventral hernia and associated pannus is seen along the anterior aspect of the lower abdominal and pelvic wall. This contains a mild amount of fluid, numerous dilated small bowel loops and a portion of transverse colon. Diffuse stranding of the mesentery is also seen within this area of herniation. A marked amount of adjacent subcutaneous  inflammatory fat stranding is seen within the lower portion of the associated pannus. Musculoskeletal: Multilevel degenerative changes seen throughout the lumbar spine. IMPRESSION: 1. High-grade small bowel obstruction secondary to an area of mesenteric twisting caused by a very large, incarcerated ventral hernia. 2. Large anterior abdominal and pelvic wall pannus secondary to the previously noted ventral hernia with marked severity inferior cellulitis and edema. 3. 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 4. Colonic diverticulosis. Electronically Signed   By: Aram Candela M.D.   On: 12/24/2020 19:36   DG Chest Portable 1 View  Result Date: 12/24/2020 CLINICAL DATA:  NG placement. EXAM: PORTABLE CHEST 1 VIEW COMPARISON:  None. FINDINGS: Enteric tube with tip and side-port in the left hemiabdomen. The tip of the tube appears in the distal body of the stomach. No bowel dilatation in the visualized abdomen. Faint bilateral pulmonary streaky densities. No focal consolidation, pleural effusion, or pneumothorax. The cardiac silhouette is within limits. Atherosclerotic calcification of the aorta. No acute osseous pathology. IMPRESSION: 1. Enteric tube with tip in the distal body of the stomach. 2. Faint bilateral pulmonary streaky densities. Electronically Signed   By: Elgie Collard M.D.   On: 12/24/2020 23:06      Subjective: Feels well.  No compaints.  No vomiting or abdominal pain since removeal of NG  Discharge Exam: Vitals:   12/25/20 2313 12/26/20 0419  BP: (!) 144/74 (!) 141/68  Pulse: 66 69  Resp: 19 18  Temp: 98.1 F (36.7 C) 98.4 F (36.9 C)  SpO2: 99% 94%   Vitals:   12/25/20 1434 12/25/20 1941 12/25/20 2313 12/26/20 0419  BP: 133/65 (!) 156/84 (!) 144/74 (!) 141/68  Pulse: 75 70 66 69  Resp: 18 18 19 18   Temp: 98.7 F (37.1 C) 98 F (36.7 C) 98.1 F (36.7 C) 98.4 F (36.9 C)  TempSrc:  Oral Oral Oral  SpO2: 98% 97% 99% 94%  Weight:    66.5 kg  Height:     5\' 7"  (1.702 m)    General: Pt is alert, awake, not in acute distress Cardiovascular: RRR, nl S1-S2, no murmurs appreciated.   No LE edema.   Respiratory: Normal respiratory rate and rhythm.  CTAB without rales or wheezes. Abdominal: Abdomen soft and non-tender. Very large ventral hernia. No distension or HSM.   Neuro/Psych: Strength symmetric in upper and lower extremities.  Judgment and insight appear normal.   The results of significant diagnostics from this hospitalization (including imaging, microbiology, ancillary and laboratory) are listed below for reference.     Microbiology: Recent Results (from the past 240 hour(s))  Resp Panel by RT-PCR (Flu A&B, Covid) Nasopharyngeal Swab     Status: None   Collection Time: 12/24/20  5:05 PM   Specimen: Nasopharyngeal Swab; Nasopharyngeal(NP) swabs in vial transport medium  Result Value Ref Range Status   SARS Coronavirus 2 by RT PCR NEGATIVE NEGATIVE Final    Comment: (NOTE) SARS-CoV-2 target nucleic acids are NOT DETECTED.  The SARS-CoV-2 RNA is generally detectable in upper respiratory specimens during the acute phase of infection. The lowest concentration of SARS-CoV-2 viral copies this assay can detect is 138 copies/mL. A negative result does not preclude SARS-Cov-2 infection and should not be used as the sole basis for treatment or other patient management decisions. A negative result may occur with  improper specimen collection/handling, submission of specimen other than nasopharyngeal swab, presence of viral mutation(s) within the areas targeted by this assay, and inadequate number of viral copies(<138 copies/mL). A negative result must be combined with clinical observations, patient history, and epidemiological information. The expected result is Negative.  Fact Sheet for Patients:  BloggerCourse.comhttps://www.fda.gov/media/152166/download  Fact Sheet for Healthcare Providers:  SeriousBroker.ithttps://www.fda.gov/media/152162/download  This test is no  t yet approved or cleared by the [place] FDA and  has been authorized for detection and/or diagnosis of SARS-CoV-2 by FDA under an Emergency Use Authorization (EUA). This EUA will remain  in effect (meaning this test can be used) for the duration of the COVID-19 declaration under Section 564(b)(1) of the Act, 21 U.S.C.section 360bbb-3(b)(1), unless the authorization is terminated  or revoked sooner.       Influenza A by PCR NEGATIVE NEGATIVE Final   Influenza B by PCR NEGATIVE NEGATIVE Final    Comment: (NOTE) The Xpert Xpress SARS-CoV-2/FLU/RSV plus assay is intended as an aid in the diagnosis of influenza from Nasopharyngeal swab specimens and should not be used as a sole basis for treatment. Nasal washings and aspirates are unacceptable for Xpert Xpress SARS-CoV-2/FLU/RSV testing.  Fact Sheet for Patients: BloggerCourse.comhttps://www.fda.gov/media/152166/download  Fact Sheet for Healthcare Providers: SeriousBroker.ithttps://www.fda.gov/media/152162/download  This test is not yet approved or cleared by the [place] FDA and has been authorized for detection and/or diagnosis of SARS-CoV-2 by FDA  under an Emergency Use Authorization (EUA). This EUA will remain in effect (meaning this test can be used) for the duration of the COVID-19 declaration under Section 564(b)(1) of the Act, 21 U.S.C. section 360bbb-3(b)(1), unless the authorization is terminated or revoked.  Performed at [facility], [address] [place], [place] [address]      Labs: BNP (last 3 results) No results for input(s): BNP in the last 8760 hours. Basic Metabolic Panel: Recent Labs  Lab 12/24/20 1703 12/25/20 0218  NA 141 138  K 3.7 3.5  CL 106 102  CO2 25 25  GLUCOSE 136* 84  BUN 11 10  CREATININE 0.77 0.74  CALCIUM 9.1 8.8*  MG  --  1.9   Liver Function Tests: Recent Labs  Lab 12/24/20 1703 12/25/20 0218  AST 43* 38  ALT 19 16  ALKPHOS 72 67  BILITOT 1.2 1.0  PROT 6.6 5.9*  ALBUMIN 4.1 3.6    Recent Labs  Lab 12/24/20 1703  LIPASE 25   No results for input(s): AMMONIA in the last 168 hours. CBC: Recent Labs  Lab 12/24/20 1703 12/25/20 0218  WBC 50.1* 61.4*  NEUTROABS 35.6* 43.3*  HGB 12.6 11.4*  HCT 39.8 35.9*  MCV 106.7* 106.5*  PLT 177 187   Cardiac Enzymes: No results for input(s): CKTOTAL, CKMB, CKMBINDEX, TROPONINI in the last 168 hours. BNP: Invalid input(s): POCBNP CBG: No results for input(s): GLUCAP in the last 168 hours. D-Dimer No results for input(s): DDIMER in the last 72 hours. Hgb A1c No results for input(s): HGBA1C in the last 72 hours. Lipid Profile No results for input(s): CHOL, HDL, LDLCALC, TRIG, CHOLHDL, LDLDIRECT in the last 72 hours. Thyroid function studies No results for input(s): TSH, T4TOTAL, T3FREE, THYROIDAB in the last 72 hours.  Invalid input(s): FREET3 Anemia work up No results for input(s): VITAMINB12, FOLATE, FERRITIN, TIBC, IRON, RETICCTPCT in the last 72 hours. Urinalysis    Component Value Date/Time   COLORURINE YELLOW 12/24/2020 1754   APPEARANCEUR HAZY (A) 12/24/2020 1754   LABSPEC 1.011 12/24/2020 1754   PHURINE 5.0 12/24/2020 1754   GLUCOSEU NEGATIVE 12/24/2020 1754   HGBUR SMALL (A) 12/24/2020 1754   BILIRUBINUR NEGATIVE 12/24/2020 1754   KETONESUR NEGATIVE 12/24/2020 1754   PROTEINUR NEGATIVE 12/24/2020 1754   UROBILINOGEN 1.0 01/17/2010 1132   NITRITE NEGATIVE 12/24/2020 1754   LEUKOCYTESUR NEGATIVE 12/24/2020 1754   Sepsis Labs Invalid input(s): PROCALCITONIN,  WBC,  LACTICIDVEN Microbiology Recent Results (from the past 240 hour(s))  Resp Panel by RT-PCR (Flu A&B, Covid) Nasopharyngeal Swab     Status: None   Collection Time: 12/24/20  5:05 PM   Specimen: Nasopharyngeal Swab; Nasopharyngeal(NP) swabs in vial transport medium  Result Value Ref Range Status   SARS Coronavirus 2 by RT PCR NEGATIVE NEGATIVE Final    Comment: (NOTE) SARS-CoV-2 target nucleic acids are NOT DETECTED.  The SARS-CoV-2  RNA is generally detectable in upper respiratory specimens during the acute phase of infection. The lowest concentration of SARS-CoV-2 viral copies this assay can detect is 138 copies/mL. A negative result does not preclude SARS-Cov-2 infection and should not be used as the sole basis for treatment or other patient management decisions. A negative result may occur with  improper specimen collection/handling, submission of specimen other than nasopharyngeal swab, presence of viral mutation(s) within the areas targeted by this assay, and inadequate number of viral copies(<138 copies/mL). A negative result must be combined with clinical observations, patient history, and epidemiological information. The expected  result is Negative.  Fact Sheet for Patients:  BloggerCourse.com  Fact Sheet for Healthcare Providers:  SeriousBroker.it  This test is no t yet approved or cleared by the [place] FDA and  has been authorized for detection and/or diagnosis of SARS-CoV-2 by FDA under an Emergency Use Authorization (EUA). This EUA will remain  in effect (meaning this test can be used) for the duration of the COVID-19 declaration under Section 564(b)(1) of the Act, 21 U.S.C.section 360bbb-3(b)(1), unless the authorization is terminated  or revoked sooner.       Influenza A by PCR NEGATIVE NEGATIVE Final   Influenza B by PCR NEGATIVE NEGATIVE Final    Comment: (NOTE) The Xpert Xpress SARS-CoV-2/FLU/RSV plus assay is intended as an aid in the diagnosis of influenza from Nasopharyngeal swab specimens and should not be used as a sole basis for treatment. Nasal washings and aspirates are unacceptable for Xpert Xpress SARS-CoV-2/FLU/RSV testing.  Fact Sheet for Patients: BloggerCourse.com  Fact Sheet for Healthcare Providers: SeriousBroker.it  This test is not yet approved or cleared by the  [place] FDA and has been authorized for detection and/or diagnosis of SARS-CoV-2 by FDA under an Emergency Use Authorization (EUA). This EUA will remain in effect (meaning this test can be used) for the duration of the COVID-19 declaration under Section 564(b)(1) of the Act, 21 U.S.C. section 360bbb-3(b)(1), unless the authorization is terminated or revoked.  Performed at [facility], [address] [place], [place] [address]      Time coordinating discharge: 45 minutes      30 Day Unplanned Readmission Risk Score    Flowsheet Row ED to Hosp-Admission (Discharged) from 12/24/2020 in [facility] GENERAL MED/SURG UNIT  30 Day Unplanned Readmission Risk Score (%) 6.9 Filed at 12/26/2020 0800       This score is the patient's risk of an unplanned readmission within 30 days of being discharged (0 -100%). The score is based on dignosis, age, lab data, medications, orders, and past utilization.   Low:  0-14.9   Medium: 15-21.9   High: 22-29.9   Extreme: 30 and above            SIGNED:   Alberteen Sam, MD  [facility] 12/26/2020, 7:09 PM

## 2020-12-26 NOTE — Progress Notes (Addendum)
Pt refused lab work also when entering pt room noted NG tube removed Pt staes "it came out and do not want it placed back in". Explained to pt earlier today importance and reason for NG tube. Pt states she is planning to leave hospital today. MD informed of above info will continue to monitor pr.

## 2020-12-26 NOTE — Progress Notes (Signed)
Patient has ordered for discharge. Given discharge instructions with paper to the patient and family. Iv removed. Given all belongings to the patient. 

## 2020-12-26 NOTE — Progress Notes (Signed)
SBO (small bowel obstruction) (HCC)  Subjective: Pt removed NG tube, states she's feeling better but has not had any BM's.  States she's going home today.  Refusing care and treatment  Objective: Vital signs in last 24 hours: Temp:  [98 F (36.7 C)-98.7 F (37.1 C)] 98.4 F (36.9 C) (08/20 0419) Pulse Rate:  [58-81] 69 (08/20 0419) Resp:  [14-28] 18 (08/20 0419) BP: (123-176)/(60-129) 141/68 (08/20 0419) SpO2:  [94 %-100 %] 94 % (08/20 0419) Weight:  [66.5 kg] 66.5 kg (08/20 0419) Last BM Date: 12/24/20  Intake/Output from previous day: 08/19 0701 - 08/20 0700 In: 0  Out: 3500 [Emesis/NG output:1800] Intake/Output this shift: No intake/output data recorded.  General appearance: alert and appears stated age  Lab Results:  No results found for this or any previous visit (from the past 24 hour(s)).   Studies/Results Radiology     MEDS, Scheduled    Assessment: SBO (small bowel obstruction) (HCC)   Plan: Pt refusing care, wants to be discharged.  Does not want surgery.    Will sign off.  Please call us if patient changes her mind.      LOS: 2 days    Vanita Panda, MD [facility], [place]    12/26/2020 8:57 AM

## 2020-12-29 DIAGNOSIS — K460 Unspecified abdominal hernia with obstruction, without gangrene: Secondary | ICD-10-CM

## 2022-07-30 IMAGING — CT CT ABD-PELV W/ CM
2 of 5 series · 16 of 46 positions shown, 18 images · IV contrast (omnipaque)
Comparison: January 17, 2010

CLINICAL DATA: Abdominal pain.

EXAM:
CT ABDOMEN AND PELVIS WITH CONTRAST
TECHNIQUE: Multidetector CT imaging of the abdomen and pelvis was performed
using the standard protocol following bolus administration of
intravenous contrast.
CONTRAST:  75mL OMNIPAQUE IOHEXOL 350 MG/ML SOLN

[Series 3: abd/ pelvis 5.0 i30f 2 · axial · 0.93mm/px · z∈[+874,+1284]mm · 13 of 94 slices shown, 15 images]
[im 6/94  soft-tissue]
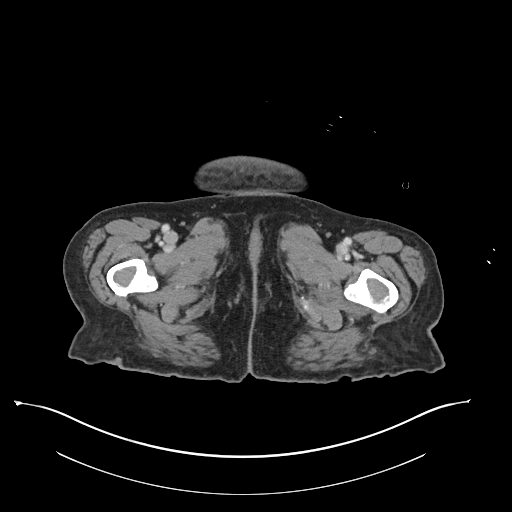
[im 6/94  bone]
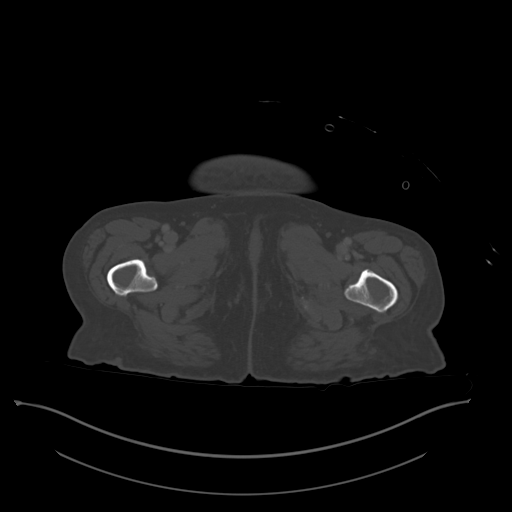
[im 11/94  soft-tissue]
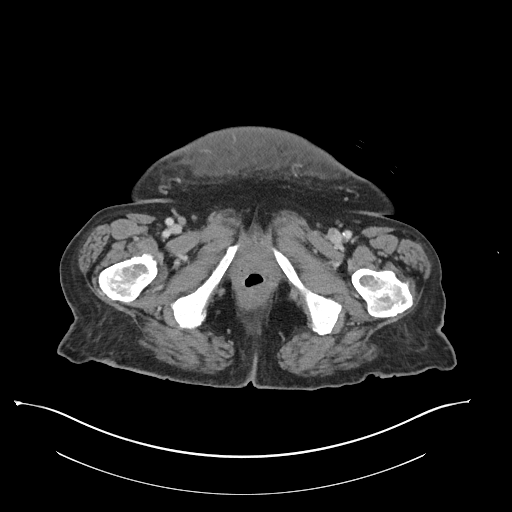
[im 21/94  soft-tissue]
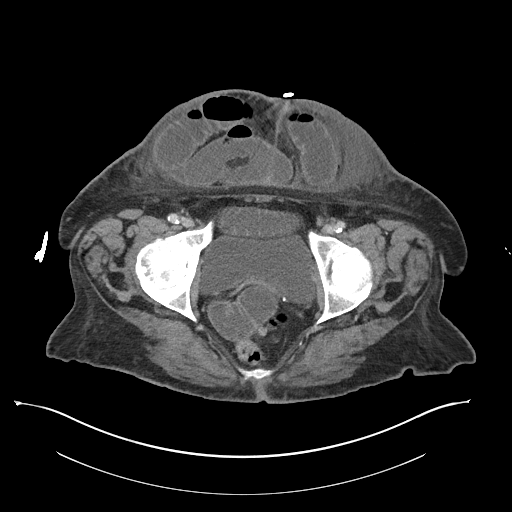
[im 26/94  soft-tissue]
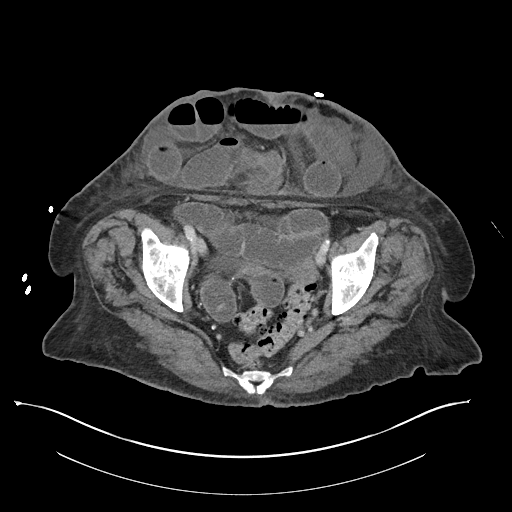
[im 32/94  soft-tissue]
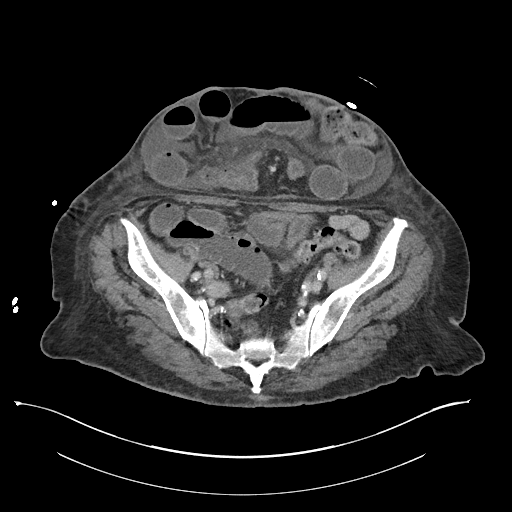
[im 42/94  soft-tissue]
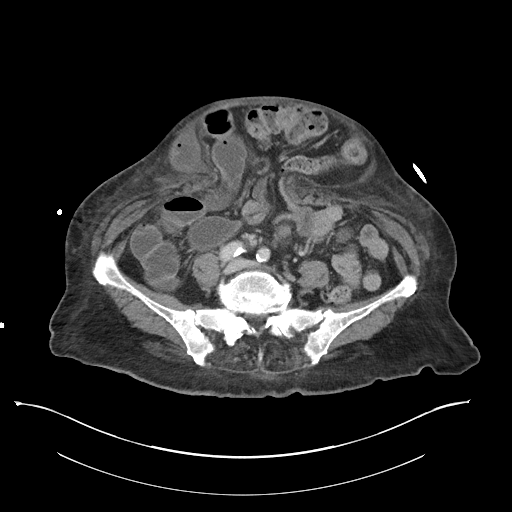
[im 47/94  soft-tissue]
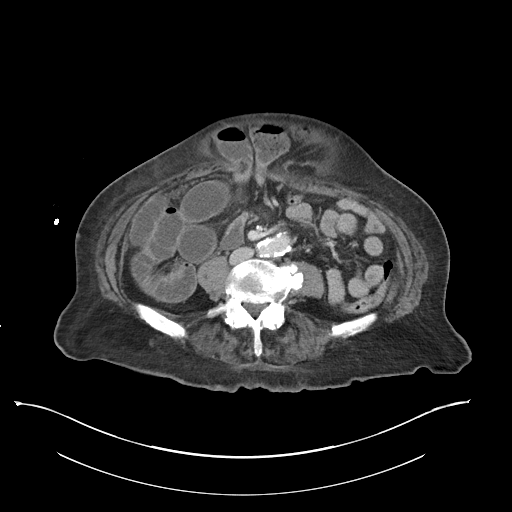
[im 52/94  soft-tissue]
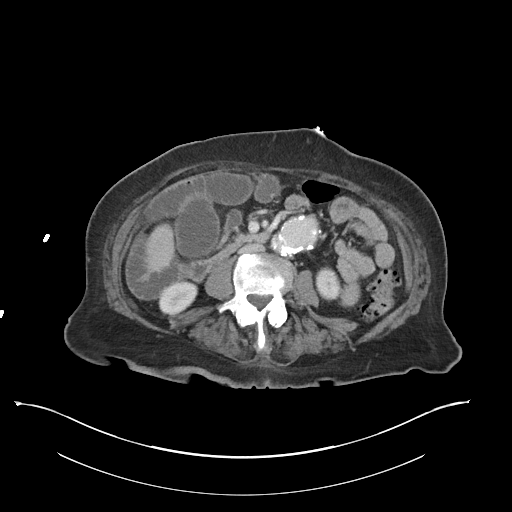
[im 63/94  soft-tissue]
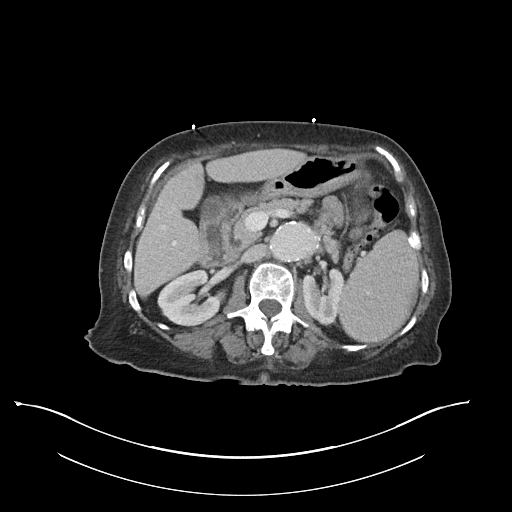
[im 63/94  bone]
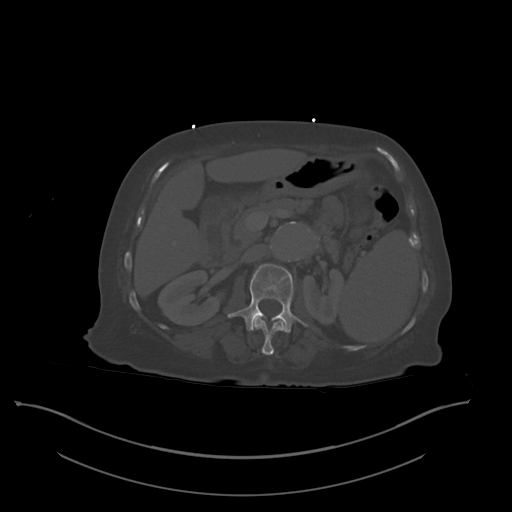
[im 68/94  soft-tissue]
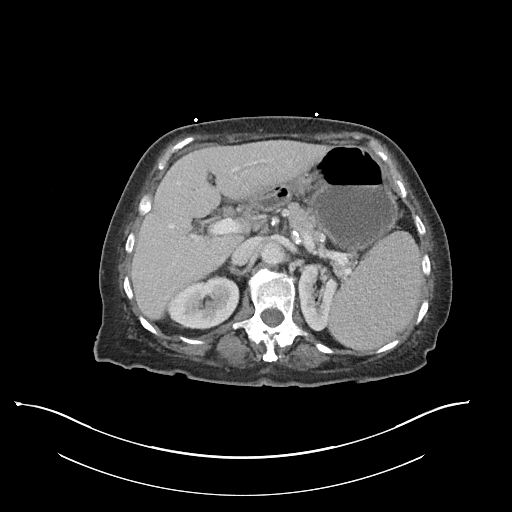
[im 73/94  soft-tissue]
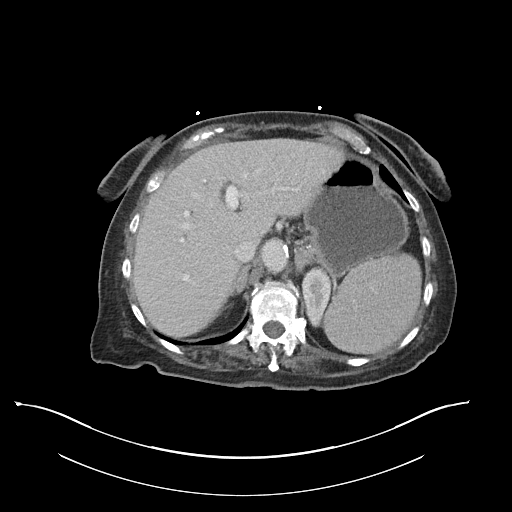
[im 83/94  soft-tissue]
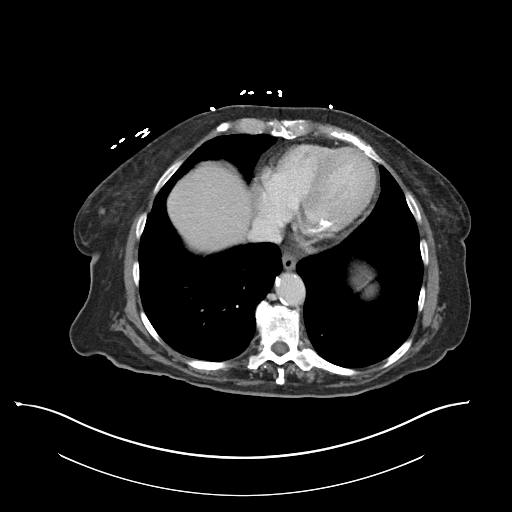
[im 88/94  soft-tissue]
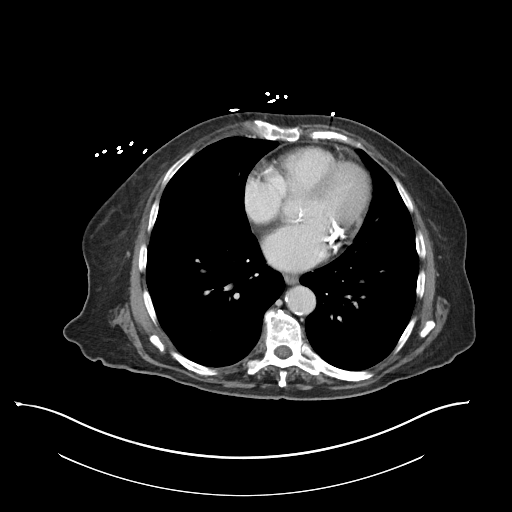

[Series 6: coronal soft tissue · coronal · 0.83mm/px · 3 of 103 slices shown]
[im 35/103  soft-tissue]
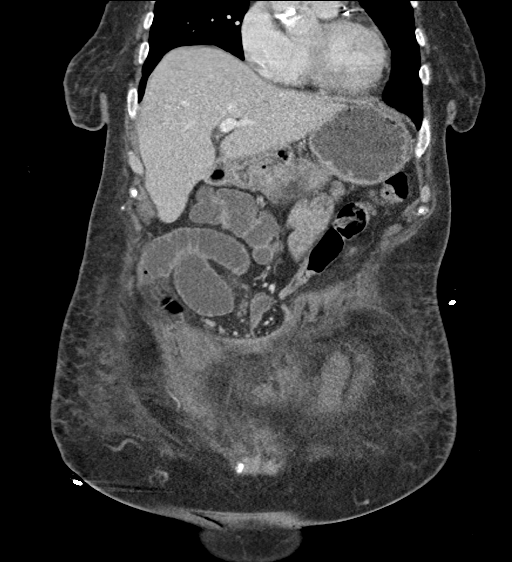
[im 46/103  soft-tissue]
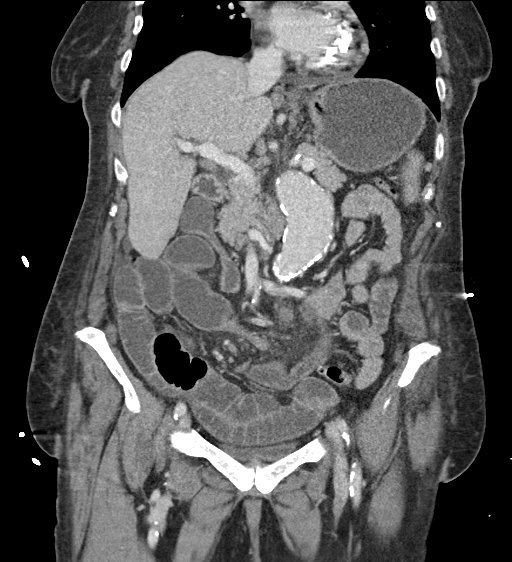
[im 57/103  soft-tissue]
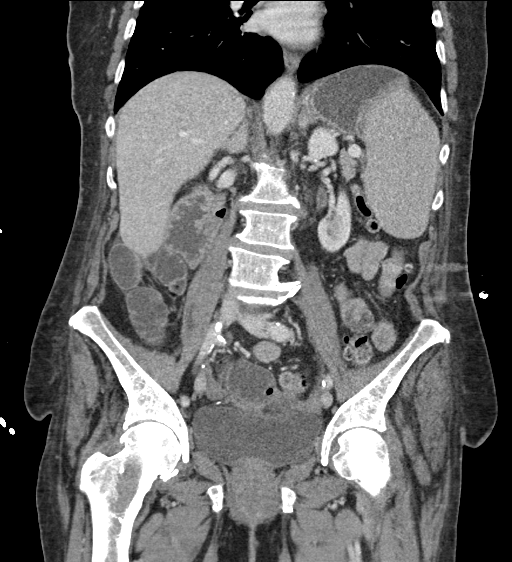

[16 of 46 positions shown; findings below may reference images not displayed]

FINDINGS: Lower chest: No acute abnormality.

Hepatobiliary: No focal liver abnormality is seen. The gallbladder
is not identified. No biliary dilatation.

Pancreas: Unremarkable. No pancreatic ductal dilatation or
surrounding inflammatory changes.

Spleen: Normal in size without focal abnormality.

Adrenals/Urinary Tract: There is diffuse bilateral adrenal gland
enlargement. Kidneys are normal, without renal calculi, focal
lesion, or hydronephrosis. Bladder is unremarkable.

Stomach/Bowel: Stomach is within normal limits. The appendix is not
identified. Numerous dilated small bowel loops are seen within the
abdomen and pelvis. A transition zone is seen along a region of
mesenteric twisting within the mid pelvis, along the midline (axial
CT images 53 through 57, CT series 3/coronal reformatted images 39
through 54, CT series 6). The small bowel loops return to normal
caliber proximally within the posterior aspect of the pelvis on the
right (axial CT images 60 through 70, CT series 3). Noninflamed
diverticula are seen throughout the large bowel.

Vascular/Lymphatic: Aortic atherosclerosis with 4.3 cm x 4.0 cm
aneurysmal dilatation of the infrarenal abdominal aorta. No enlarged
abdominal or pelvic lymph nodes.

Reproductive: Status post hysterectomy. No adnexal masses.

Other: A very large, approximately 23.3 cm x 9.8 cm x 17.4 cm
ventral hernia and associated pannus is seen along the anterior
aspect of the lower abdominal and pelvic wall. This contains a mild
amount of fluid, numerous dilated small bowel loops and a portion of
transverse colon. Diffuse stranding of the mesentery is also seen
within this area of herniation. A marked amount of adjacent
subcutaneous inflammatory fat stranding is seen within the lower
portion of the associated pannus.

Musculoskeletal: Multilevel degenerative changes seen throughout the
lumbar spine.
IMPRESSION: 1. High-grade small bowel obstruction secondary to an area of
mesenteric twisting caused by a very large, incarcerated ventral
hernia.
2. Large anterior abdominal and pelvic wall pannus secondary to the
previously noted ventral hernia with marked severity inferior
cellulitis and edema.
3. 4.3 cm x 4.0 cm infrarenal abdominal aortic aneurysm.
4. Colonic diverticulosis.

## 2023-02-07 DEATH — deceased
